# Patient Record
Sex: Female | Born: 1935 | Race: White | Hispanic: No | Smoking: Never smoker
Health system: Southern US, Community
[De-identification: ages and names within clinical notes are randomized; demographics above are authoritative.]

## PROBLEM LIST (undated history)

## (undated) DIAGNOSIS — F0280 Dementia in other diseases classified elsewhere without behavioral disturbance: Secondary | ICD-10-CM

## (undated) DIAGNOSIS — I639 Cerebral infarction, unspecified: Secondary | ICD-10-CM

## (undated) DIAGNOSIS — C9000 Multiple myeloma not having achieved remission: Secondary | ICD-10-CM

## (undated) DIAGNOSIS — F0390 Unspecified dementia without behavioral disturbance: Secondary | ICD-10-CM

## (undated) DIAGNOSIS — G309 Alzheimer's disease, unspecified: Secondary | ICD-10-CM

## (undated) DIAGNOSIS — I10 Essential (primary) hypertension: Secondary | ICD-10-CM

## (undated) HISTORY — DX: Cerebral infarction, unspecified: I63.9

---

## 2012-02-20 ENCOUNTER — Encounter (INDEPENDENT_AMBULATORY_CARE_PROVIDER_SITE_OTHER): Payer: Medicare Other | Admitting: Ophthalmology

## 2012-02-20 DIAGNOSIS — I10 Essential (primary) hypertension: Secondary | ICD-10-CM

## 2012-02-20 DIAGNOSIS — H35039 Hypertensive retinopathy, unspecified eye: Secondary | ICD-10-CM

## 2012-02-20 DIAGNOSIS — H2510 Age-related nuclear cataract, unspecified eye: Secondary | ICD-10-CM

## 2012-02-20 DIAGNOSIS — H43819 Vitreous degeneration, unspecified eye: Secondary | ICD-10-CM

## 2012-03-23 ENCOUNTER — Encounter (INDEPENDENT_AMBULATORY_CARE_PROVIDER_SITE_OTHER): Payer: Medicare Other | Admitting: Ophthalmology

## 2012-03-23 DIAGNOSIS — H35039 Hypertensive retinopathy, unspecified eye: Secondary | ICD-10-CM

## 2012-03-23 DIAGNOSIS — I10 Essential (primary) hypertension: Secondary | ICD-10-CM

## 2012-03-23 DIAGNOSIS — H4310 Vitreous hemorrhage, unspecified eye: Secondary | ICD-10-CM

## 2012-03-23 DIAGNOSIS — H2510 Age-related nuclear cataract, unspecified eye: Secondary | ICD-10-CM

## 2012-03-23 DIAGNOSIS — H43819 Vitreous degeneration, unspecified eye: Secondary | ICD-10-CM

## 2012-05-25 ENCOUNTER — Encounter (INDEPENDENT_AMBULATORY_CARE_PROVIDER_SITE_OTHER): Payer: Medicare Other | Admitting: Ophthalmology

## 2012-05-25 DIAGNOSIS — H4310 Vitreous hemorrhage, unspecified eye: Secondary | ICD-10-CM

## 2012-05-25 DIAGNOSIS — H43819 Vitreous degeneration, unspecified eye: Secondary | ICD-10-CM

## 2012-05-25 DIAGNOSIS — H35039 Hypertensive retinopathy, unspecified eye: Secondary | ICD-10-CM

## 2012-05-25 DIAGNOSIS — H2510 Age-related nuclear cataract, unspecified eye: Secondary | ICD-10-CM

## 2016-09-07 ENCOUNTER — Encounter (HOSPITAL_COMMUNITY): Payer: Self-pay | Admitting: *Deleted

## 2016-09-07 ENCOUNTER — Emergency Department (HOSPITAL_COMMUNITY): Payer: Medicare HMO

## 2016-09-07 ENCOUNTER — Inpatient Hospital Stay (HOSPITAL_COMMUNITY)
Admission: EM | Admit: 2016-09-07 | Discharge: 2016-09-13 | DRG: 064 | Disposition: A | Discharge status: Skilled Nursing Facility | Payer: Medicare HMO | Attending: Neurology | Admitting: Neurology

## 2016-09-07 DIAGNOSIS — I509 Heart failure, unspecified: Secondary | ICD-10-CM | POA: Diagnosis not present

## 2016-09-07 DIAGNOSIS — I10 Essential (primary) hypertension: Secondary | ICD-10-CM

## 2016-09-07 DIAGNOSIS — R4182 Altered mental status, unspecified: Secondary | ICD-10-CM | POA: Diagnosis not present

## 2016-09-07 DIAGNOSIS — D62 Acute posthemorrhagic anemia: Secondary | ICD-10-CM | POA: Diagnosis present

## 2016-09-07 DIAGNOSIS — S06350A Traumatic hemorrhage of left cerebrum without loss of consciousness, initial encounter: Secondary | ICD-10-CM

## 2016-09-07 DIAGNOSIS — I5189 Other ill-defined heart diseases: Secondary | ICD-10-CM

## 2016-09-07 DIAGNOSIS — E039 Hypothyroidism, unspecified: Secondary | ICD-10-CM

## 2016-09-07 DIAGNOSIS — Z681 Body mass index (BMI) 19 or less, adult: Secondary | ICD-10-CM | POA: Diagnosis not present

## 2016-09-07 DIAGNOSIS — E785 Hyperlipidemia, unspecified: Secondary | ICD-10-CM | POA: Diagnosis present

## 2016-09-07 DIAGNOSIS — I612 Nontraumatic intracerebral hemorrhage in hemisphere, unspecified: Secondary | ICD-10-CM | POA: Diagnosis not present

## 2016-09-07 DIAGNOSIS — I110 Hypertensive heart disease with heart failure: Secondary | ICD-10-CM | POA: Diagnosis present

## 2016-09-07 DIAGNOSIS — I5032 Chronic diastolic (congestive) heart failure: Secondary | ICD-10-CM | POA: Diagnosis present

## 2016-09-07 DIAGNOSIS — R4701 Aphasia: Secondary | ICD-10-CM | POA: Diagnosis present

## 2016-09-07 DIAGNOSIS — E876 Hypokalemia: Secondary | ICD-10-CM | POA: Diagnosis present

## 2016-09-07 DIAGNOSIS — I611 Nontraumatic intracerebral hemorrhage in hemisphere, cortical: Secondary | ICD-10-CM | POA: Diagnosis present

## 2016-09-07 DIAGNOSIS — E46 Unspecified protein-calorie malnutrition: Secondary | ICD-10-CM | POA: Diagnosis present

## 2016-09-07 DIAGNOSIS — R29810 Facial weakness: Secondary | ICD-10-CM | POA: Diagnosis present

## 2016-09-07 DIAGNOSIS — E8809 Other disorders of plasma-protein metabolism, not elsewhere classified: Secondary | ICD-10-CM

## 2016-09-07 DIAGNOSIS — C9000 Multiple myeloma not having achieved remission: Secondary | ICD-10-CM | POA: Diagnosis present

## 2016-09-07 DIAGNOSIS — R000 Tachycardia, unspecified: Secondary | ICD-10-CM

## 2016-09-07 DIAGNOSIS — E119 Type 2 diabetes mellitus without complications: Secondary | ICD-10-CM | POA: Diagnosis present

## 2016-09-07 DIAGNOSIS — E871 Hypo-osmolality and hyponatremia: Secondary | ICD-10-CM | POA: Diagnosis present

## 2016-09-07 DIAGNOSIS — I619 Nontraumatic intracerebral hemorrhage, unspecified: Secondary | ICD-10-CM | POA: Diagnosis not present

## 2016-09-07 DIAGNOSIS — I519 Heart disease, unspecified: Secondary | ICD-10-CM | POA: Diagnosis not present

## 2016-09-07 DIAGNOSIS — G936 Cerebral edema: Secondary | ICD-10-CM | POA: Diagnosis present

## 2016-09-07 DIAGNOSIS — S06320A Contusion and laceration of left cerebrum without loss of consciousness, initial encounter: Secondary | ICD-10-CM

## 2016-09-07 HISTORY — DX: Multiple myeloma not having achieved remission: C90.00

## 2016-09-07 HISTORY — DX: Essential (primary) hypertension: I10

## 2016-09-07 LAB — DIFFERENTIAL
BASOS PCT: 0 %
Basophils Absolute: 0 10*3/uL (ref 0.0–0.1)
EOS ABS: 0 10*3/uL (ref 0.0–0.7)
EOS PCT: 0 %
LYMPHS ABS: 0.8 10*3/uL (ref 0.7–4.0)
Lymphocytes Relative: 11 %
MONOS PCT: 6 %
Monocytes Absolute: 0.5 10*3/uL (ref 0.1–1.0)
Neutro Abs: 6.2 10*3/uL (ref 1.7–7.7)
Neutrophils Relative %: 83 %

## 2016-09-07 LAB — COMPREHENSIVE METABOLIC PANEL
ALT: 15 U/L (ref 14–54)
ANION GAP: 11 (ref 5–15)
AST: 23 U/L (ref 15–41)
Albumin: 3.9 g/dL (ref 3.5–5.0)
Alkaline Phosphatase: 43 U/L (ref 38–126)
BILIRUBIN TOTAL: 0.8 mg/dL (ref 0.3–1.2)
BUN: 13 mg/dL (ref 6–20)
CHLORIDE: 96 mmol/L — AB (ref 101–111)
CO2: 24 mmol/L (ref 22–32)
Calcium: 9.2 mg/dL (ref 8.9–10.3)
Creatinine, Ser: 0.88 mg/dL (ref 0.44–1.00)
GFR, EST NON AFRICAN AMERICAN: 59 mL/min — AB (ref >60)
Glucose, Bld: 102 mg/dL — ABNORMAL HIGH (ref 65–99)
POTASSIUM: 3.2 mmol/L — AB (ref 3.5–5.1)
Sodium: 131 mmol/L — ABNORMAL LOW (ref 135–145)
TOTAL PROTEIN: 6.7 g/dL (ref 6.5–8.1)

## 2016-09-07 LAB — I-STAT CHEM 8, ED
BUN: 15 mg/dL (ref 6–20)
CALCIUM ION: 1.13 mmol/L — AB (ref 1.15–1.40)
Chloride: 99 mmol/L — ABNORMAL LOW (ref 101–111)
Creatinine, Ser: 0.9 mg/dL (ref 0.44–1.00)
Glucose, Bld: 102 mg/dL — ABNORMAL HIGH (ref 65–99)
HCT: 36 % (ref 36.0–46.0)
HEMOGLOBIN: 12.2 g/dL (ref 12.0–15.0)
Potassium: 3.4 mmol/L — ABNORMAL LOW (ref 3.5–5.1)
SODIUM: 136 mmol/L (ref 135–145)
TCO2: 25 mmol/L (ref 0–100)

## 2016-09-07 LAB — APTT: APTT: 30 s (ref 24–36)

## 2016-09-07 LAB — CBC
HEMATOCRIT: 35.8 % — AB (ref 36.0–46.0)
HEMOGLOBIN: 12.2 g/dL (ref 12.0–15.0)
MCH: 30.6 pg (ref 26.0–34.0)
MCHC: 34.1 g/dL (ref 30.0–36.0)
MCV: 89.7 fL (ref 78.0–100.0)
Platelets: 193 10*3/uL (ref 150–400)
RBC: 3.99 MIL/uL (ref 3.87–5.11)
RDW: 13.1 % (ref 11.5–15.5)
WBC: 7.5 10*3/uL (ref 4.0–10.5)

## 2016-09-07 LAB — PROTIME-INR
INR: 1.02
Prothrombin Time: 13.5 seconds (ref 11.4–15.2)

## 2016-09-07 LAB — I-STAT TROPONIN, ED: TROPONIN I, POC: 0 ng/mL (ref 0.00–0.08)

## 2016-09-07 LAB — CBG MONITORING, ED: GLUCOSE-CAPILLARY: 103 mg/dL — AB (ref 65–99)

## 2016-09-07 MED ORDER — PANTOPRAZOLE SODIUM 40 MG IV SOLR
40.0000 mg | Freq: Every day | INTRAVENOUS | Status: DC
  Administered 2016-09-08 (×2): 40 mg via INTRAVENOUS
  Filled 2016-09-07 (×2): qty 40

## 2016-09-07 MED ORDER — SODIUM CHLORIDE 0.9 % IV SOLN
INTRAVENOUS | Status: DC
  Administered 2016-09-07: 23:00:00 via INTRAVENOUS
  Administered 2016-09-08: 75 mL/h via INTRAVENOUS

## 2016-09-07 MED ORDER — SENNOSIDES-DOCUSATE SODIUM 8.6-50 MG PO TABS
1.0000 | ORAL_TABLET | Freq: Two times a day (BID) | ORAL | Status: DC
  Administered 2016-09-08 – 2016-09-13 (×9): 1 via ORAL
  Filled 2016-09-07 (×10): qty 1

## 2016-09-07 MED ORDER — STROKE: EARLY STAGES OF RECOVERY BOOK
Freq: Once | Status: AC
  Administered 2016-09-07: 23:00:00
  Filled 2016-09-07: qty 1

## 2016-09-07 MED ORDER — IOPAMIDOL (ISOVUE-370) INJECTION 76%
INTRAVENOUS | Status: AC
  Administered 2016-09-07: 50 mL
  Filled 2016-09-07: qty 50

## 2016-09-07 MED ORDER — ACETAMINOPHEN 650 MG RE SUPP: 650.0000 mg | RECTAL | Status: DC | PRN

## 2016-09-07 MED ORDER — NICARDIPINE HCL IN NACL 20-0.86 MG/200ML-% IV SOLN
3.0000 mg/h | Freq: Once | INTRAVENOUS | Status: AC
  Administered 2016-09-07: 3 mg/h via INTRAVENOUS
  Filled 2016-09-07 (×2): qty 200

## 2016-09-07 MED ORDER — ACETAMINOPHEN 325 MG PO TABS
650.0000 mg | ORAL_TABLET | ORAL | Status: DC | PRN
  Administered 2016-09-11: 650 mg via ORAL
  Filled 2016-09-07: qty 2

## 2016-09-07 NOTE — ED Notes (Signed)
Patient transported to CT 

## 2016-09-07 NOTE — ED Notes (Signed)
Per MD Mesner, hold Cardene at this time as patient's SBPs have returned to goal at 140.

## 2016-09-07 NOTE — ED Triage Notes (Signed)
The pts speech clears for a few minutes then gets garbled again she follows directions intermittently  And has to be shown what you want then she does exactly as you ask  Headache today according to her brother who brought her here. Now she just says im ok

## 2016-09-07 NOTE — ED Provider Notes (Signed)
[facility] DEPT Provider Note   CSN: [id number] Arrival date & time: 09/07/16  1626     History   Chief Complaint Chief Complaint  Patient presents with  . Altered Mental Status    HPI Judith Henderson is a 82 y.o. female.  Her brother states that the patient is a 49 year old with unknown past medical history are unknown medications who's had at least a month worth of intermittent word slurring but over the last 24-48 hours significantly worsened and started having aphasia as well. He did not know see no neurologic deficits. She has no history of the same. As far as he knows she is not a stroke. No other recent illnesses. No exacerbating factors or associated symptoms otherwise.   The history is provided by a relative.    No past medical history on file.  There are no active problems to display for this patient.   No past surgical history on file.  OB History    No data available       Home Medications    Prior to Admission medications   Not on File    Family History No family history on file.  Social History Social History  Substance Use Topics  . Smoking status: Never Smoker  . Smokeless tobacco: Never Used  . Alcohol use No     Allergies   Patient has no known allergies.   Review of Systems Review of Systems  Unable to perform ROS: Mental status change     Physical Exam Updated Vital Signs BP 143/65   Pulse 73   Temp 98.7 F (37.1 C)   Resp 18   SpO2 98%   Physical Exam  Constitutional: She appears well-developed and well-nourished.  HENT:  Head: Normocephalic and atraumatic.  Eyes: Conjunctivae and EOM are normal.  Neck: Normal range of motion.  Cardiovascular: Normal rate and regular rhythm.   No murmur heard. Pulmonary/Chest: No stridor. No respiratory distress.  Abdominal: Soft. She exhibits no distension.  Musculoskeletal: Normal range of motion. She exhibits no edema or deformity.  Neurological: She is alert. No cranial  nerve deficit (no obvious CN deficits). She exhibits normal muscle tone. Coordination abnormal.  Does not follow directions  Skin: Skin is warm and dry.  Nursing note and vitals reviewed.    ED Treatments / Results  Labs (all labs ordered are listed, but only abnormal results are displayed) Labs Reviewed  CBC - Abnormal; Notable for the following:       Result Value   HCT 35.8 (*)    All other components within normal limits  COMPREHENSIVE METABOLIC PANEL - Abnormal; Notable for the following:    Sodium 131 (*)    Potassium 3.2 (*)    Chloride 96 (*)    Glucose, Bld 102 (*)    GFR calc non Af Amer 59 (*)    All other components within normal limits  CBG MONITORING, ED - Abnormal; Notable for the following:    Glucose-Capillary 103 (*)    All other components within normal limits  I-STAT CHEM 8, ED - Abnormal; Notable for the following:    Potassium 3.4 (*)    Chloride 99 (*)    Glucose, Bld 102 (*)    Calcium, Ion 1.13 (*)    All other components within normal limits  PROTIME-INR  APTT  DIFFERENTIAL  Randolm Idol, ED    EKG  EKG Interpretation  Date/Time:  Saturday September 07 2016 17:31:02 EST Ventricular Rate:  75 PR  Interval:  162 QRS Duration: 84 QT Interval:  380 QTC Calculation: 424 R Axis:   64 Text Interpretation:  Normal sinus rhythm Normal ECG No old tracing to compare Confirmed by [facility] MD, Imanii Gosdin [phone]) on 09/07/2016 5:36:35 PM       Radiology Ct Head Wo Contrast  Result Date: 09/07/2016 CLINICAL DATA:  Altered mental status.  Dizziness. EXAM: CT HEAD WITHOUT CONTRAST TECHNIQUE:?: TECHNIQUE:? Contiguous axial images were obtained from the base of the skull through the vertex without intravenous contrast. COMPARISON:  None. FINDINGS: Brain: A large hemorrhage within the left temporal lobe measures 6.2 x 2.0 x 2.3 cm (volume = 15 cm^3). This results in mass effect with effacement of the overlying sulci. No significant midline shift is present. Mild  atrophy and white matter disease is present otherwise. The brainstem and cerebellum are within normal limits. Vascular: Atherosclerotic calcifications are present within the cavernous internal carotid arteries and at the dural margin of the vertebral arteries. There is no hyperdense vessel. Skull: The calvarium is intact. Sinuses/Orbits: A single posterior right ethmoid air cell is opacified. The remaining paranasal sinuses and mastoid air cells are clear. IMPRESSION: 1. Large volume hemorrhage within the left temporal lobe. This is an atypical location for hemorrhage. Underlying mass is not excluded. Significant mass effect is present as described. There is no midline shift or herniation. Right follow-up MRI of the brain without and with contrast in 2-3 weeks after initial blood products have partially cleared. 2. Mild atrophy and white matter disease otherwise. 3. Atherosclerosis. Electronically Signed   By: San Morelle M.D.   On: 09/07/2016 18:34    Procedures Procedures (including critical care time)  CRITICAL CARE Performed by: Merrily Pew Total critical care time: 35 minutes Critical care time was exclusive of separately billable procedures and treating other patients. Critical care was necessary to treat or prevent imminent or life-threatening deterioration. Critical care was time spent personally by me on the following activities: development of treatment plan with patient and/or surrogate as well as nursing, discussions with consultants, evaluation of patient's response to treatment, examination of patient, obtaining history from patient or surrogate, ordering and performing treatments and interventions, ordering and review of laboratory studies, ordering and review of radiographic studies, pulse oximetry and re-evaluation of patient's condition.   Medications Ordered in ED Medications - No data to display   Initial Impression / Assessment and Plan / ED Course  I have reviewed  the triage vital signs and the nursing notes.  Pertinent labs & imaging results that were available during my care of the patient were reviewed by me and considered in my medical decision making (see chart for details).  Clinical Course    Large temporal head bleed. No obvious seizures. BP ok, no indication for nicardipine. Discussed with neurology who will see. Paged NSG to comment as well.  NSG says no acute intervention at this time. Neurology will admit. Prn nicardipine ordered as her BP sees to be increasing slowly.   Final Clinical Impressions(s) / ED Diagnoses   Final diagnoses:  Intraparenchymal hematoma of brain, left, without loss of consciousness, initial encounter [facility])    New Prescriptions New Prescriptions   No medications on file     Merrily Pew, MD 09/08/16 [phone]

## 2016-09-07 NOTE — ED Triage Notes (Signed)
The pt  Cannot speak very well garbled speech   She cannot answer questions  She cannot follow commands or instructiions.  Her brother is with her  She has had this since yesterday  When her brother spoke to her on the phone.  Today he went to check on her and found all her symptoms worse  She cares for her ibed ridden husband

## 2016-09-07 NOTE — ED Notes (Signed)
Purse with all contents and wrist watch taken home by brother [place]. Rings remain on Patient's left hand as we could not remove them. Clothing and jacket remain with patient.

## 2016-09-07 NOTE — H&P (Signed)
Admission H&P    Chief Complaint: Acute onset of confusion and speech abnormality.  HPI: Judith Henderson is an 82 y.o. female with a history hypertension and hyperlipidemia presenting with new onset of confusion and speech output abnormality. She has difficulty understanding what is being said to her and speech output is nonsensical, for the most part. Symptoms started on 09/06/2016 and were noted by family to be worse today. No facial droop is been seen. Speech is not been slurred. She's had no focal weakness of extremities. There is no previous history of stroke nor TIA. CT scan of her head showed a large volume hemorrhage involving the left temporal lobe with surrounding edema and mass effect. CT angiogram showed no signs of aneurysm, nor AVM or mass lesion.NIH stroke score was 5.  LSN: 09/06/2016 tPA Given: No: Acute ICH mRankin:  History reviewed. No pertinent past medical history.  No past surgical history on file.  No family history on file. Social History:  reports that she has never smoked. She has never used smokeless tobacco. She reports that she does not drink alcohol. Her drug history is not on file.  Allergies: No Known Allergies  Medications: Preadmission medicat ROS: Unavailable due to patient's acute aphasia.  Physical Examination: Blood pressure 134/65, pulse 68, temperature 98.7 F (37.1 C), resp. rate 15, SpO2 99 %.  HEENT-  Normocephalic, no lesions, without obvious abnormality.  Normal external eye and conjunctiva.  Normal TM's bilaterally.  Normal auditory canals and external ears. Normal external nose, mucus membranes and septum.  Normal pharynx. Neck supple with no masses, nodes, nodules or enlargement. Cardiovascular - regular rate and rhythm, S1, S2 normal, no murmur, click, rub or gallop Lungs - chest clear, no wheezing, rales, normal symmetric air entry Abdomen - soft, non-tender; bowel sounds normal; no masses,  no organomegaly Extremities - no joint  deformities, effusion, or inflammation and no edema  Neurologic Examination: Mental Status: Alert, moderately severe receptive and expressive aphasia. Marked difficulty understanding simple commands. Cranial Nerves: II-Visual fields were normal to visual confrontation. III/IV/VI-Pupils were equal and reacted normally to light. Extraocular movements were full and conjugate.    V/VII-no facial numbness and no facial weakness. VIII-normal. X-no dysarthria.. Motor: 5/5 bilaterally with normal tone and bulk Sensory: Normal throughout. Deep Tendon Reflexes: 1+ and symmetric. Plantars: Mute bilaterally Carotid auscultation: Normal  Results for orders placed or performed during the hospital encounter of 09/07/16 (from the past 48 hour(s))  CBG monitoring, ED     Status: Abnormal   Collection Time: 09/07/16  5:50 PM  Result Value Ref Range   Glucose-Capillary 103 (H) 65 - 99 mg/dL  I-stat troponin, ED     Status: None   Collection Time: 09/07/16  5:59 PM  Result Value Ref Range   Troponin i, poc 0.00 0.00 - 0.08 ng/mL   Comment 3            Comment: Due to the release kinetics of cTnI, a negative result within the first hours of the onset of symptoms does not rule out myocardial infarction with certainty. If myocardial infarction is still suspected, repeat the test at appropriate intervals.   I-Stat Chem 8, ED     Status: Abnormal   Collection Time: 09/07/16  6:01 PM  Result Value Ref Range   Sodium 136 135 - 145 mmol/L   Potassium 3.4 (L) 3.5 - 5.1 mmol/L   Chloride 99 (L) 101 - 111 mmol/L   BUN 15 6 - 20 mg/dL     Creatinine, Ser 0.90 0.44 - 1.00 mg/dL   Glucose, Bld 102 (H) 65 - 99 mg/dL   Calcium, Ion 1.13 (L) 1.15 - 1.40 mmol/L   TCO2 25 0 - 100 mmol/L   Hemoglobin 12.2 12.0 - 15.0 g/dL   HCT 36.0 36.0 - 46.0 %  Protime-INR     Status: None   Collection Time: 09/07/16  6:02 PM  Result Value Ref Range   Prothrombin Time 13.5 11.4 - 15.2 seconds   INR 1.02   APTT      Status: None   Collection Time: 09/07/16  6:02 PM  Result Value Ref Range   aPTT 30 24 - 36 seconds  CBC     Status: Abnormal   Collection Time: 09/07/16  6:02 PM  Result Value Ref Range   WBC 7.5 4.0 - 10.5 K/uL   RBC 3.99 3.87 - 5.11 MIL/uL   Hemoglobin 12.2 12.0 - 15.0 g/dL   HCT 35.8 (L) 36.0 - 46.0 %   MCV 89.7 78.0 - 100.0 fL   MCH 30.6 26.0 - 34.0 pg   MCHC 34.1 30.0 - 36.0 g/dL   RDW 13.1 11.5 - 15.5 %   Platelets 193 150 - 400 K/uL  Differential     Status: None   Collection Time: 09/07/16  6:02 PM  Result Value Ref Range   Neutrophils Relative % 83 %   Neutro Abs 6.2 1.7 - 7.7 K/uL   Lymphocytes Relative 11 %   Lymphs Abs 0.8 0.7 - 4.0 K/uL   Monocytes Relative 6 %   Monocytes Absolute 0.5 0.1 - 1.0 K/uL   Eosinophils Relative 0 %   Eosinophils Absolute 0.0 0.0 - 0.7 K/uL   Basophils Relative 0 %   Basophils Absolute 0.0 0.0 - 0.1 K/uL  Comprehensive metabolic panel     Status: Abnormal   Collection Time: 09/07/16  6:02 PM  Result Value Ref Range   Sodium 131 (L) 135 - 145 mmol/L   Potassium 3.2 (L) 3.5 - 5.1 mmol/L   Chloride 96 (L) 101 - 111 mmol/L   CO2 24 22 - 32 mmol/L   Glucose, Bld 102 (H) 65 - 99 mg/dL   BUN 13 6 - 20 mg/dL   Creatinine, Ser 0.88 0.44 - 1.00 mg/dL   Calcium 9.2 8.9 - 10.3 mg/dL   Total Protein 6.7 6.5 - 8.1 g/dL   Albumin 3.9 3.5 - 5.0 g/dL   AST 23 15 - 41 U/L   ALT 15 14 - 54 U/L   Alkaline Phosphatase 43 38 - 126 U/L   Total Bilirubin 0.8 0.3 - 1.2 mg/dL   GFR calc non Af Amer 59 (L) >60 mL/min   GFR calc Af Amer >60 >60 mL/min    Comment: (NOTE) The eGFR has been calculated using the CKD EPI equation. This calculation has not been validated in all clinical situations. eGFR's persistently <60 mL/min signify possible Chronic Kidney Disease.    Anion gap 11 5 - 15   Ct Head Wo Contrast  Result Date: 09/07/2016 CLINICAL DATA:  Altered mental status.  Dizziness. EXAM: CT HEAD WITHOUT CONTRAST TECHNIQUE:?: TECHNIQUE:?  Contiguous axial images were obtained from the base of the skull through the vertex without intravenous contrast. COMPARISON:  None. FINDINGS: Brain: A large hemorrhage within the left temporal lobe measures 6.2 x 2.0 x 2.3 cm (volume = 15 cm^3). This results in mass effect with effacement of the overlying sulci. No significant midline shift is present. Mild atrophy  and white matter disease is present otherwise. The brainstem and cerebellum are within normal limits. Vascular: Atherosclerotic calcifications are present within the cavernous internal carotid arteries and at the dural margin of the vertebral arteries. There is no hyperdense vessel. Skull: The calvarium is intact. Sinuses/Orbits: A single posterior right ethmoid air cell is opacified. The remaining paranasal sinuses and mastoid air cells are clear. IMPRESSION: 1. Large volume hemorrhage within the left temporal lobe. This is an atypical location for hemorrhage. Underlying mass is not excluded. Significant mass effect is present as described. There is no midline shift or herniation. Right follow-up MRI of the brain without and with contrast in 2-3 weeks after initial blood products have partially cleared. 2. Mild atrophy and white matter disease otherwise. 3. Atherosclerosis. Electronically Signed   By: Christopher  Mattern M.D.   On: 09/07/2016 18:34    Assessment: 82 y.o. female history of hypertension and hyperli presenting with acute left intraparenchymal hemorrhage involving the temporal lobe with associated vasogenic edema and mass effect.  Stroke Risk Factors - hypertension, hyperlipidemia  Plan: 1. HgbA1c, fasting lipid panel 2. MRI of the brain without contrast 3. Speech consult 4. Echocardiogram 5. Prophylactic therapy-None 6. Risk factor modification 8. Telemetry monitoring  C.R. , MD [facility] [phone]  09/07/2016, 8:23 PM    

## 2016-09-07 NOTE — ED Notes (Signed)
Pt. Returned from Ct scan, placed on monitor and BP cuff.

## 2016-09-07 NOTE — ED Triage Notes (Signed)
The pt does not live here  She lives in [place]  Neither her or the pts brother know her pmh no meds with them

## 2016-09-08 ENCOUNTER — Inpatient Hospital Stay (HOSPITAL_COMMUNITY): Payer: Medicare HMO

## 2016-09-08 ENCOUNTER — Encounter (HOSPITAL_COMMUNITY): Payer: Self-pay | Admitting: Neurology

## 2016-09-08 DIAGNOSIS — I10 Essential (primary) hypertension: Secondary | ICD-10-CM

## 2016-09-08 LAB — SODIUM
Sodium: 139 mmol/L (ref 135–145)
Sodium: 136 mmol/L (ref 135–145)

## 2016-09-08 LAB — GLUCOSE, CAPILLARY
Glucose-Capillary: 83 mg/dL (ref 65–99)
Glucose-Capillary: 81 mg/dL (ref 65–99)
Glucose-Capillary: 91 mg/dL (ref 65–99)
Glucose-Capillary: 105 mg/dL — ABNORMAL HIGH (ref 65–99)

## 2016-09-08 LAB — MRSA PCR SCREENING: MRSA by PCR: NEGATIVE

## 2016-09-08 MED ORDER — GADOBENATE DIMEGLUMINE 529 MG/ML IV SOLN
10.0000 mL | Freq: Once | INTRAVENOUS | Status: AC | PRN
  Administered 2016-09-08: 10 mL via INTRAVENOUS

## 2016-09-08 MED ORDER — WHITE PETROLATUM GEL
Status: AC
  Administered 2016-09-08: 1
  Filled 2016-09-08: qty 1

## 2016-09-08 MED ORDER — SODIUM CHLORIDE 0.9 % IV SOLN
30.0000 meq | Freq: Once | INTRAVENOUS | Status: AC
  Administered 2016-09-08: 30 meq via INTRAVENOUS
  Filled 2016-09-08: qty 15

## 2016-09-08 MED ORDER — NICARDIPINE HCL IN NACL 20-0.86 MG/200ML-% IV SOLN
3.0000 mg/h | INTRAVENOUS | Status: DC
  Administered 2016-09-08 (×2): 5 mg/h via INTRAVENOUS
  Filled 2016-09-08: qty 200

## 2016-09-08 MED ORDER — BISACODYL 10 MG RE SUPP: 10.0000 mg | Freq: Every day | RECTAL | Status: DC | PRN

## 2016-09-08 MED ORDER — INSULIN ASPART 100 UNIT/ML ~~LOC~~ SOLN
0.0000 [IU] | Freq: Four times a day (QID) | SUBCUTANEOUS | Status: DC
  Administered 2016-09-11 – 2016-09-12 (×2): 1 [IU] via SUBCUTANEOUS

## 2016-09-08 NOTE — Progress Notes (Signed)
STROKE TEAM PROGRESS NOTE   HISTORY OF PRESENT ILLNESS (per record) Judith Henderson is an 82 y.o. female with a history hypertension and hyperlipidemia presenting with new onset of confusion and speech output abnormality. She has difficulty understanding what is being said to her and speech output is nonsensical, for the most part. Symptoms started on 09/06/2016 and were noted by family to be worse today. No facial droop is been seen. Speech is not been slurred. She's had no focal weakness of extremities. There is no previous history of stroke nor TIA. CT scan of her head showed a large volume hemorrhage involving the left temporal lobe with surrounding edema and mass effect. CT angiogram showed no signs of aneurysm, nor AVM or mass lesion.NIH stroke score was 5.  LSN: 09/06/2016 tPA Given: No: Acute ICH mRankin:   SUBJECTIVE (INTERVAL HISTORY) Her family was not at the bedside.  Overall she is unable to express how she feels due to her aphasia.  Her RN did not report changes in her condition overnight; thus, her condition appears to unchanged. The Cardene is presently on hold   OBJECTIVE Temp:  [98.3 F (36.8 C)-98.8 F (37.1 C)] 98.7 F (37.1 C) (12/03 0438) Pulse Rate:  [61-81] 70 (12/03 0600) Resp:  [12-28] 18 (12/03 0600) BP: (122-160)/(55-144) 140/63 (12/03 0600) SpO2:  [85 %-100 %] 97 % (12/03 0600) Weight:  [48.8 kg (107 lb 9.4 oz)] 48.8 kg (107 lb 9.4 oz) (12/02 2330)  CBC:   Recent Labs Lab 09/07/16 1801 09/07/16 1802  WBC  --  7.5  NEUTROABS  --  6.2  HGB 12.2 12.2  HCT 36.0 35.8*  MCV  --  89.7  PLT  --  0000000    Basic Metabolic Panel:   Recent Labs Lab 09/07/16 1801 09/07/16 1802  NA 136 131*  K 3.4* 3.2*  CL 99* 96*  CO2  --  24  GLUCOSE 102* 102*  BUN 15 13  CREATININE 0.90 0.88  CALCIUM  --  9.2    Lipid Panel: No results found for: CHOL, TRIG, HDL, CHOLHDL, VLDL, LDLCALC HgbA1c: No results found for: HGBA1C Urine Drug Screen: No results found  for: LABOPIA, COCAINSCRNUR, LABBENZ, AMPHETMU, THCU, LABBARB    IMAGING  Ct Angio Head and Neck W Or Wo Contrast 09/07/2016 1. Focal enhancing mass lesion or vascular abnormality to explain the left temporal hemorrhage.  2. Mild atherosclerotic changes at the carotid bifurcations bilaterally without significant stenosis.  3. Mild atherosclerotic changes at the aortic arch without significant stenosis.  4. Irregularity of the cervical left internal carotid artery raises the possibility of fibromuscular dysplasia.  5. Tortuosity of the cervical internal carotid arteries bilaterally likely resulting from chronic hypertension.   Ct Head Wo Contrast 09/07/2016 1. Large volume hemorrhage within the left temporal lobe. This is an atypical location for hemorrhage. Underlying mass is not excluded. Significant mass effect is present as described. There is no midline shift or herniation. Recommend follow-up MRI of the brain without and with contrast in 2-3 weeks after initial blood products have partially cleared.  2. Mild atrophy and white matter disease otherwise.  3. Atherosclerosis.    PHYSICAL EXAM HEENT-  Normocephalic, no lesions, without obvious abnormality.  Normal external eye and conjunctiva.  Normal TM's bilaterally.  Normal auditory canals and external ears. Normal external nose, mucus membranes and septum.  Normal pharynx. Neck supple with no masses, nodes, nodules or enlargement. Cardiovascular - regular rate and rhythm, S1, S2 normal, no murmur, click, rub  or gallop Lungs - chest clear, no wheezing, rales, normal symmetric air entry Abdomen - soft, non-tender; bowel sounds normal; no masses,  no organomegaly Extremities - no joint deformities, effusion, or inflammation and no edema  Neurologic Examination: Mental Status: Alert, moderately severe receptive and expressive aphasia. Marked difficulty understanding simple commands.  Cranial Nerves: II-Visual fields were normal to  visual confrontation. III/IV/VI-Pupils were equal and reacted normally to light. Extraocular movements were full and conjugate.    V/VII-no facial numbness and no facial weakness. VIII-normal. X-difficult to assess dysarthria given severity of aphasia and paucity of speech  Motor: 5/5 bilaterally with normal tone and bulk Sensory: Normal throughout. Deep Tendon Reflexes: 1+ and symmetric. Coordination:  Could no be assessed as patient unable to follow commands or mimic Gait:  defered  ASSESSMENT/PLAN [name] is a 82 y.o. female with history of hypertension and hyperlipidemia presenting with speech difficulties and confusion. She did not receive IV t-PA due to hemorrhagic stroke.  Hemorrhagic stroke:  Dominant - Large volume hemorrhage within the left temporal lobe.   Resultant  Global aphasia  MRI - pending  MRA - refer to CTA  CTA H&N - Focal enhancing mass lesion or vascular abnormality to explain the left temporal hemorrhage.    Irregularity of the cervical left internal carotid artery raises the possibility of fibromuscular dysplasia.   CT Head - Large volume hemorrhage within the left temporal lobe.   Carotid Doppler - CTA neck done  2D Echo - not indicated  LDL - not indicated  HgbA1c  - not indicated  VTE prophylaxis -  SCDs Diet NPO time specified  No antithrombotic prior to admission, now on No antithrombotic secondary to hemorrhage.  Ongoing aggressive stroke risk factor management  Therapy recommendations: Pending  Disposition:  Pending  Hypertension  BP - somewhat high at times - Cardene drip on/off  Current goal set at <160  Added lisinopril 10mg  daily  Long-term BP goal normotensive  Hyperlipidemia  Home meds: Zocor 20 mg daily not resumed in hospital.  Continue statin at discharge  Other Stroke Risk Factors  Advanced age  Other Active Problems  Hypokalemia - 3.2 -> supplement - Bmet in AM  Hyponatremia - 131  Atypical  location for hemorrhage. Underlying mass is not excluded -> MRI with contrast.  Hypertension - Cardene drip  Hospital day # 1  CRITICAL CARE NEUROLOGY ATTENDING NOTE Patient was seen and examined by me personally. I independently viewed imaging studies, participated in medical decision making and plan of care. The laboratory and radiographic studies were personally reviewed by me.  ROS:  Pertinent positives could not be fully documented due to aphasia  Assessment and plan completed by me personally and fully documented above.  MRI of head with and without contrast  Q6 hour sodium checks to monitor hyponatremia; may need to institute hypertonic saline for cerebral edema.  Ordered call MD if NA<130  AMLS:  CMP and CBC  Continue goal SBP<160  Started Lisinopril 10mg  daily, goal to remain off of Cardene gtt  Initiate CCM protocols for electrolytes, blood sugars and bowel management  Discontinue bedrest; order PT/OT/ST  If fails ST evaluation will need NGT and nutrition consult  Condition is unchanged   This patient is critically ill and at significant risk of neurological worsening, death and care requires constant monitoring of vital signs, hemodynamics,respiratory and cardiac monitoring, extensive review of multiple databases, frequent neurological assessment, discussion with family, other specialists and medical decision making of high complexity.  This critical care time does not reflect procedure time, or teaching time or supervisory time of PA/NP/Med Resident etc. but could involve care discussion time.  I spent 35 minutes of Neurocritical Care time in the care of  this patient.  SIGNED BY: Dr. Elissa Hefty     To contact Stroke Continuity provider, please refer to [email or web address] After hours, contact General Neurology

## 2016-09-08 NOTE — Evaluation (Addendum)
Clinical/Bedside Swallow Evaluation Patient Details  Name: Judith Henderson MRN: [id number] Date of Birth: 08-09-1930  Today's Date: 09/08/2016 Time: SLP Start Time (ACUTE ONLY): 1716 SLP Stop Time (ACUTE ONLY): 1735 SLP Time Calculation (min) (ACUTE ONLY): 19 min  Past Medical History:  Past Medical History:  Diagnosis Date  . Essential hypertension    Past Surgical History: No past surgical history on file. HPI:  Kobee Sikkema an 82 y.o.femalewith ahistory hypertension, hyperlipidemia presenting with new onset of confusion and speech output abnormality. CT scan of her head showed a large volume hemorrhage involving the left temporal lobe with surrounding edema and mass effect.    Assessment / Plan / Recommendation Clinical Impression  Mildly prolonged oral manipulation with risk for pocketing due to right sided weakness. No s/s aspiration present however will continue to treat to further determine safety and efficiency of texture and monitor vitals for suspicion of silent aspiration. Recommend Dys 3 texture, thin liquids, pills whole in applesauce, no straws initially and check for right sided pocketing.  Speech-language-cognitive assessment will be initiated.    Aspiration Risk  Moderate aspiration risk    Diet Recommendation Dysphagia 3 (Mech soft);Thin liquid   Liquid Administration via: Cup;No straw Medication Administration: Whole meds with puree Supervision: Patient able to self feed;Full supervision/cueing for compensatory strategies Compensations: Slow rate;Small sips/bites;Lingual sweep for clearance of pocketing Postural Changes: Seated upright at 90 degrees    Other  Recommendations Oral Care Recommendations: Oral care BID   Follow up Recommendations  (TBD)      Frequency and Duration min 2x/week  2 weeks       Prognosis Prognosis for Safe Diet Advancement: Good      Swallow Study   General HPI: Judith Henderson an 82 y.o.femalewith ahistory  hypertension, hyperlipidemia presenting with new onset of confusion and speech output abnormality. CT scan of her head showed a large volume hemorrhage involving the left temporal lobe with surrounding edema and mass effect.  Type of Study: Bedside Swallow Evaluation Previous Swallow Assessment:  (none) Diet Prior to this Study: NPO Temperature Spikes Noted: No Respiratory Status: Room air History of Recent Intubation: No Behavior/Cognition: Alert;Cooperative;Pleasant mood;Requires cueing Oral Cavity Assessment: Within Functional Limits Oral Care Completed by SLP: No Oral Cavity - Dentition: Adequate natural dentition Vision: Functional for self-feeding Self-Feeding Abilities: Able to feed self Patient Positioning: Upright in bed Baseline Vocal Quality: Normal Volitional Cough: Strong Volitional Swallow: Able to elicit    Oral/Motor/Sensory Function Overall Oral Motor/Sensory Function: Moderate impairment Facial ROM: Reduced right;Suspected CN VII (facial) dysfunction Facial Symmetry: Abnormal symmetry right;Suspected CN VII (facial) dysfunction Facial Strength: Suspected CN VII (facial) dysfunction;Reduced right Facial Sensation: Reduced right Lingual ROM:  (decreased ROM)   Ice Chips Ice chips: Not tested   Thin Liquid Thin Liquid: Impaired Presentation: Cup;Straw Pharyngeal  Phase Impairments: Suspected delayed Swallow    Nectar Thick Nectar Thick Liquid: Not tested   Honey Thick Honey Thick Liquid: Not tested   Puree Puree: Within functional limits   Solid   GO   Solid: Impaired Oral Phase Impairments: Reduced lingual movement/coordination        Houston Siren 09/08/2016,5:49 PM [phone]

## 2016-09-08 NOTE — Progress Notes (Signed)
Spoke to Dr. Belenda Cruise and MD approved for pt to go to MRI without RN.

## 2016-09-09 ENCOUNTER — Inpatient Hospital Stay (HOSPITAL_COMMUNITY): Payer: Medicare HMO

## 2016-09-09 ENCOUNTER — Encounter (HOSPITAL_COMMUNITY): Payer: Self-pay | Admitting: Physical Medicine & Rehabilitation

## 2016-09-09 DIAGNOSIS — E119 Type 2 diabetes mellitus without complications: Secondary | ICD-10-CM

## 2016-09-09 DIAGNOSIS — I519 Heart disease, unspecified: Secondary | ICD-10-CM

## 2016-09-09 DIAGNOSIS — C9000 Multiple myeloma not having achieved remission: Secondary | ICD-10-CM

## 2016-09-09 DIAGNOSIS — E876 Hypokalemia: Secondary | ICD-10-CM

## 2016-09-09 DIAGNOSIS — I612 Nontraumatic intracerebral hemorrhage in hemisphere, unspecified: Secondary | ICD-10-CM

## 2016-09-09 DIAGNOSIS — E46 Unspecified protein-calorie malnutrition: Secondary | ICD-10-CM

## 2016-09-09 DIAGNOSIS — E039 Hypothyroidism, unspecified: Secondary | ICD-10-CM

## 2016-09-09 DIAGNOSIS — I10 Essential (primary) hypertension: Secondary | ICD-10-CM

## 2016-09-09 DIAGNOSIS — I509 Heart failure, unspecified: Secondary | ICD-10-CM

## 2016-09-09 DIAGNOSIS — D62 Acute posthemorrhagic anemia: Secondary | ICD-10-CM

## 2016-09-09 DIAGNOSIS — I5189 Other ill-defined heart diseases: Secondary | ICD-10-CM

## 2016-09-09 DIAGNOSIS — I619 Nontraumatic intracerebral hemorrhage, unspecified: Secondary | ICD-10-CM

## 2016-09-09 DIAGNOSIS — R000 Tachycardia, unspecified: Secondary | ICD-10-CM

## 2016-09-09 LAB — CBC
HCT: 31.3 % — ABNORMAL LOW (ref 36.0–46.0)
Hemoglobin: 10.8 g/dL — ABNORMAL LOW (ref 12.0–15.0)
MCH: 30.8 pg (ref 26.0–34.0)
MCHC: 34.5 g/dL (ref 30.0–36.0)
MCV: 89.2 fL (ref 78.0–100.0)
PLATELETS: 188 10*3/uL (ref 150–400)
RBC: 3.51 MIL/uL — ABNORMAL LOW (ref 3.87–5.11)
RDW: 13.1 % (ref 11.5–15.5)
WBC: 5.8 10*3/uL (ref 4.0–10.5)

## 2016-09-09 LAB — LIPID PANEL
Cholesterol: 176 mg/dL (ref 0–200)
HDL: 51 mg/dL (ref >40)
LDL CALC: 113 mg/dL — AB (ref 0–99)
Total CHOL/HDL Ratio: 3.5 RATIO
Triglycerides: 62 mg/dL (ref <150)
VLDL: 12 mg/dL (ref 0–40)

## 2016-09-09 LAB — GLUCOSE, CAPILLARY
GLUCOSE-CAPILLARY: 100 mg/dL — AB (ref 65–99)
GLUCOSE-CAPILLARY: 136 mg/dL — AB (ref 65–99)
GLUCOSE-CAPILLARY: 87 mg/dL (ref 65–99)
GLUCOSE-CAPILLARY: 90 mg/dL (ref 65–99)
Glucose-Capillary: 104 mg/dL — ABNORMAL HIGH (ref 65–99)

## 2016-09-09 LAB — ECHOCARDIOGRAM COMPLETE
Height: 64 in
Weight: 1760.15 [oz_av]

## 2016-09-09 LAB — COMPREHENSIVE METABOLIC PANEL
ALT: 13 U/L — AB (ref 14–54)
AST: 20 U/L (ref 15–41)
Albumin: 3.3 g/dL — ABNORMAL LOW (ref 3.5–5.0)
Alkaline Phosphatase: 34 U/L — ABNORMAL LOW (ref 38–126)
Anion gap: 9 (ref 5–15)
BILIRUBIN TOTAL: 0.9 mg/dL (ref 0.3–1.2)
BUN: 11 mg/dL (ref 6–20)
CO2: 22 mmol/L (ref 22–32)
CREATININE: 0.74 mg/dL (ref 0.44–1.00)
Calcium: 8.4 mg/dL — ABNORMAL LOW (ref 8.9–10.3)
Chloride: 105 mmol/L (ref 101–111)
Glucose, Bld: 91 mg/dL (ref 65–99)
Potassium: 3.1 mmol/L — ABNORMAL LOW (ref 3.5–5.1)
Sodium: 136 mmol/L (ref 135–145)
TOTAL PROTEIN: 5.9 g/dL — AB (ref 6.5–8.1)

## 2016-09-09 LAB — VITAMIN B12: Vitamin B-12: 2034 pg/mL — ABNORMAL HIGH (ref 180–914)

## 2016-09-09 LAB — TSH: TSH: 1.741 u[IU]/mL (ref 0.350–4.500)

## 2016-09-09 LAB — SODIUM: SODIUM: 138 mmol/L (ref 135–145)

## 2016-09-09 MED ORDER — LEVOTHYROXINE SODIUM 75 MCG PO TABS
75.0000 ug | ORAL_TABLET | Freq: Every day | ORAL | Status: DC
  Administered 2016-09-09 – 2016-09-13 (×5): 75 ug via ORAL
  Filled 2016-09-09 (×5): qty 1

## 2016-09-09 MED ORDER — QUETIAPINE FUMARATE 25 MG PO TABS
25.0000 mg | ORAL_TABLET | Freq: Once | ORAL | Status: AC
  Administered 2016-09-09: 25 mg via ORAL
  Filled 2016-09-09: qty 1

## 2016-09-09 MED ORDER — LABETALOL HCL 5 MG/ML IV SOLN: 10.0000 mg | INTRAVENOUS | Status: DC | PRN

## 2016-09-09 MED ORDER — QUETIAPINE FUMARATE 50 MG PO TABS
50.0000 mg | ORAL_TABLET | Freq: Once | ORAL | Status: AC
  Administered 2016-09-09: 50 mg via ORAL
  Filled 2016-09-09: qty 1

## 2016-09-09 MED ORDER — SIMVASTATIN 20 MG PO TABS
20.0000 mg | ORAL_TABLET | Freq: Every day | ORAL | Status: DC
  Administered 2016-09-09 – 2016-09-12 (×4): 20 mg via ORAL
  Filled 2016-09-09: qty 4
  Filled 2016-09-09 (×3): qty 1

## 2016-09-09 MED ORDER — PANTOPRAZOLE SODIUM 40 MG PO TBEC
40.0000 mg | DELAYED_RELEASE_TABLET | Freq: Every day | ORAL | Status: DC
  Administered 2016-09-09 – 2016-09-13 (×5): 40 mg via ORAL
  Filled 2016-09-09 (×5): qty 1

## 2016-09-09 MED ORDER — LOSARTAN POTASSIUM 50 MG PO TABS
100.0000 mg | ORAL_TABLET | Freq: Every day | ORAL | Status: DC
  Administered 2016-09-09 – 2016-09-13 (×5): 100 mg via ORAL
  Filled 2016-09-09 (×5): qty 2

## 2016-09-09 NOTE — Consult Note (Signed)
Physical Medicine and Rehabilitation Consult Reason for Consult: Acute left intraparenchymal hemorrhage Referring Physician: Dr.Xu   HPI: Judith Henderson is a 82 y.o.right handed  female with history of diastolic congestive heart failure, diabetes mellitus, multiple myeloma and essential hypertension. History taken from chart review and family.  Patient independent prior to admission she is the caregiver for her spouse. One level home with a basement. 3-4 steps to entry. She does have family coming in from out-of-town to assist but only for a short time. Presented 09/07/2016 with aphasia and altered mental status. CT of the head showed large volume hemorrhage within the left temporal lobe. CT angiogram head and neck showed mild atherosclerotic changes without significant stenosis. MRI of the brain 09/08/2016 no evidence of mass vascular lesion or amyloid angiography. Hematoma size and mass effect stable from prior tracings. Echocardiogram with ejection fraction of 62% grade 2 diastolic dysfunction. Neurology consulted workup ongoing and continued monitoring. Tolerating a mechanical soft diet. Physical therapy evaluation completed 09/09/2016 with recommendations of physical medicine rehabilitation consult.   Review of Systems  Unable to perform ROS: Language   Past Medical History:  Diagnosis Date  . Essential hypertension    No past surgical history on file., unable to obtain from patient No family history on file., unable to obtain from patient Social History:  reports that she has never smoked. She has never used smokeless tobacco. She reports that she does not drink alcohol. Her drug history is not on file. Allergies:  Allergies  Allergen Reactions  . Amoxicillin Itching  . Oxycodone Itching  . Penicillin G Rash   Medications Prior to Admission  Medication Sig Dispense Refill  . levothyroxine (SYNTHROID, LEVOTHROID) 75 MCG tablet Take 75 mcg by mouth daily.    Marland Kitchen losartan  (COZAAR) 100 MG tablet Take 100 mg by mouth daily.    . mirtazapine (REMERON) 15 MG tablet Take 15 mg by mouth daily.    . simvastatin (ZOCOR) 20 MG tablet Take 20 mg by mouth daily.    . temazepam (RESTORIL) 15 MG capsule Take 15 mg by mouth at bedtime.      Home: Home Living Family/patient expects to be discharged to:: Private residence Living Arrangements: Spouse/significant other Available Help at Discharge: Family Type of Home: House Home Access: Stairs to enter Technical brewer of Steps: 3-4 Entrance Stairs-Rails: Right Home Layout: Two level, Laundry or work area in basement Alternate Therapist, sports of Steps: office in basement- 1 flight Alternate Level Stairs-Rails: Right Home Equipment: Environmental consultant - 2 wheels, Wheelchair - manual Additional Comments: Equipment is for husband. Pt is caregiver for spouse "Jeneen Rinks" who is total care per brother (present in room).  Functional History: Prior Function Level of Independence: Independent Comments: Drives, cooks, cleans, cares for husband. Brother, Edd Arbour, present in room states she does everything for her spouse. Functional Status:  Mobility: Bed Mobility General bed mobility comments: Up in chair upon PT arrival.  Transfers Overall transfer level: Needs assistance Equipment used: None Transfers: Sit to/from Stand General transfer comment: Min guard for safety as pt unsteady ins tanding with some sway noted. Pt reaching behind therapist's back for support. Ambulation/Gait Ambulation/Gait assistance: Min assist Ambulation Distance (Feet): 80 Feet Assistive device: 1 person hand held assist Gait Pattern/deviations: Step-through pattern, Decreased stride length General Gait Details: Slow, mildly unsteady gait with pt holding around therapist's waist for support. Constant Min A for balance. VSS. Gait velocity: decreased    ADL:    Cognition: Cognition Overall Cognitive  Status: Difficult to assess (Able to state name  correctly with cues.) Orientation Level: Other (comment) (global aphasia) Cognition Arousal/Alertness: Awake/alert Behavior During Therapy: WFL for tasks assessed/performed Overall Cognitive Status: Difficult to assess (Able to state name correctly with cues.) Area of Impairment: Following commands Following Commands: Follows one step commands with increased time, Follows multi-step commands consistently, Follows one step commands inconsistently General Comments: Does great with demonstration and imitation; seems to have more receptive >expressive aphasia but difficult to tell.  Difficult to assess due to: Impaired communication  Blood pressure (!) 145/64, pulse 78, temperature 98.5 F (36.9 C), temperature source Oral, resp. rate 16, height _0  (1.626 m), weight 49.9 kg (110 lb 0.2 oz), SpO2 100 %. Physical Exam  Vitals reviewed. Constitutional: She appears well-developed and well-nourished.  HENT:  Head: Normocephalic and atraumatic.  Eyes: Conjunctivae and EOM are normal.  Neck: Normal range of motion. Neck supple. No thyromegaly present.  Cardiovascular: Normal rate and regular rhythm.   Respiratory: Effort normal and breath sounds normal. No respiratory distress.  GI: Soft. Bowel sounds are normal. She exhibits no distension.  Musculoskeletal: She exhibits no edema or tenderness.  Neurological: She is alert.  Expressive and receptive aphasia.  She does provide some simple phrases. Moving all 4 extremities against gravity.  Skin: Skin is warm and dry.  Psychiatric:  Unable to assess due to mentation    Results for orders placed or performed during the hospital encounter of 09/07/16 (from the past 24 hour(s))  Sodium     Status: None   Collection Time: 09/08/16  1:58 PM  Result Value Ref Range   Sodium 139 135 - 145 mmol/L  Glucose, capillary     Status: None   Collection Time: 09/08/16  4:25 PM  Result Value Ref Range   Glucose-Capillary 81 65 - 99 mg/dL  Sodium      Status: None   Collection Time: 09/08/16  6:10 PM  Result Value Ref Range   Sodium 136 135 - 145 mmol/L  Glucose, capillary     Status: None   Collection Time: 09/08/16  7:01 PM  Result Value Ref Range   Glucose-Capillary 91 65 - 99 mg/dL   Comment 1 Notify RN   Glucose, capillary     Status: Abnormal   Collection Time: 09/08/16 11:16 PM  Result Value Ref Range   Glucose-Capillary 105 (H) 65 - 99 mg/dL  Sodium     Status: None   Collection Time: 09/08/16 11:34 PM  Result Value Ref Range   Sodium 138 135 - 145 mmol/L  Glucose, capillary     Status: None   Collection Time: 09/09/16  5:42 AM  Result Value Ref Range   Glucose-Capillary 87 65 - 99 mg/dL  Comprehensive metabolic panel     Status: Abnormal   Collection Time: 09/09/16  5:57 AM  Result Value Ref Range   Sodium 136 135 - 145 mmol/L   Potassium 3.1 (L) 3.5 - 5.1 mmol/L   Chloride 105 101 - 111 mmol/L   CO2 22 22 - 32 mmol/L   Glucose, Bld 91 65 - 99 mg/dL   BUN 11 6 - 20 mg/dL   Creatinine, Ser 0.74 0.44 - 1.00 mg/dL   Calcium 8.4 (L) 8.9 - 10.3 mg/dL   Total Protein 5.9 (L) 6.5 - 8.1 g/dL   Albumin 3.3 (L) 3.5 - 5.0 g/dL   AST 20 15 - 41 U/L   ALT 13 (L) 14 - 54 U/L  Alkaline Phosphatase 34 (L) 38 - 126 U/L   Total Bilirubin 0.9 0.3 - 1.2 mg/dL   GFR calc non Af Amer >60 >60 mL/min   GFR calc Af Amer >60 >60 mL/min   Anion gap 9 5 - 15  CBC     Status: Abnormal   Collection Time: 09/09/16  5:57 AM  Result Value Ref Range   WBC 5.8 4.0 - 10.5 K/uL   RBC 3.51 (L) 3.87 - 5.11 MIL/uL   Hemoglobin 10.8 (L) 12.0 - 15.0 g/dL   HCT 31.3 (L) 36.0 - 46.0 %   MCV 89.2 78.0 - 100.0 fL   MCH 30.8 26.0 - 34.0 pg   MCHC 34.5 30.0 - 36.0 g/dL   RDW 13.1 11.5 - 15.5 %   Platelets 188 150 - 400 K/uL  Vitamin B12     Status: Abnormal   Collection Time: 09/09/16  5:57 AM  Result Value Ref Range   Vitamin B-12 2,034 (H) 180 - 914 pg/mL  Lipid panel     Status: Abnormal   Collection Time: 09/09/16  5:57 AM  Result Value  Ref Range   Cholesterol 176 0 - 200 mg/dL   Triglycerides 62 <150 mg/dL   HDL 51 >40 mg/dL   Total CHOL/HDL Ratio 3.5 RATIO   VLDL 12 0 - 40 mg/dL   LDL Cholesterol 113 (H) 0 - 99 mg/dL  TSH     Status: None   Collection Time: 09/09/16  5:57 AM  Result Value Ref Range   TSH 1.741 0.350 - 4.500 uIU/mL  Glucose, capillary     Status: Abnormal   Collection Time: 09/09/16  8:39 AM  Result Value Ref Range   Glucose-Capillary 136 (H) 65 - 99 mg/dL  Glucose, capillary     Status: Abnormal   Collection Time: 09/09/16 11:57 AM  Result Value Ref Range   Glucose-Capillary 104 (H) 65 - 99 mg/dL   Comment 1 Notify RN    Comment 2 Document in Chart    Ct Angio Head W Or Wo Contrast  Result Date: 09/07/2016 CLINICAL DATA:  Left temporal lobe hemorrhage. EXAM: CT ANGIOGRAPHY HEAD AND NECK TECHNIQUE: Multidetector CT imaging of the head and neck was performed using the standard protocol during bolus administration of intravenous contrast. Multiplanar CT image reconstructions and MIPs were obtained to evaluate the vascular anatomy. Carotid stenosis measurements (when applicable) are obtained utilizing NASCET criteria, using the distal internal carotid diameter as the denominator. CONTRAST:  50 mL Isovue 370 COMPARISON:  CT head without contrast from the same day. FINDINGS: CT HEAD FINDINGS Brain: Left temporal lobe parenchymal hemorrhage is again seen. Mass effect is not significantly changed. CTA NECK FINDINGS Aortic arch: A 3 vessel arch configuration is present. Minimal atherosclerotic changes are present without a significant stenosis at the level of the arch. Right carotid system: The right common carotid artery is within normal limits. Atherosclerotic calcifications are present at the right carotid bifurcation without a significant stenosis. There is mild tortuosity of the cervical right ICA without significant stenosis. Left carotid system: The left common carotid artery is within normal limits. Minimal  calcifications are present at the carotid bifurcation without a significant stenosis. Mild tortuosity is present within the cervical left ICA. There is slight irregularity of the mid cervical left ICA. Vertebral arteries: The vertebral arteries both originate from the subclavian arteries. The left vertebral artery is the dominant vessel is. The vertebral arteries are within normal limits throughout the neck. Skeleton: Multilevel spondylosis  is present throughout the cervical spine. Degenerative anterolisthesis is present at C3-4, C4-5, and C5-6. There is chronic loss of height at C6-7. Multilevel facet degenerative changes are noted. No focal lytic or blastic lesions. Other neck: No focal mucosal or submucosal lesions are present. The thyroid is within normal limits. Salivary glands are unremarkable. No significant cervical adenopathy is present. Upper chest: The lung apices are clear. There is mild scarring at both lung apices. The superior mediastinum is otherwise unremarkable. Review of the MIP images confirms the above findings CTA HEAD FINDINGS Anterior circulation: Atherosclerotic calcifications are present within the cavernous internal carotid arteries bilaterally without a significant stenosis through the ICA termini. The A1 and M1 segments are normal. The anterior communicating artery is patent. MCA bifurcations are intact. ACA and MCA branch vessels are within normal limits. The hemorrhage the displaces vessels within the left temporal lobe. There is no focal mass lesion to explain the hemorrhage. Posterior circulation: A left PICA origin is below the dura. The left vertebral artery is dominant. Vertebrobasilar junction is within normal limits. Both posterior cerebral arteries originate from the basilar tip. PCA branch vessels are within normal limits bilaterally. Venous sinuses: The dural sinuses are patent. The right transverse sinus is dominant. The straight sinus and deep cerebral veins are intact.  Cortical veins are unremarkable. Anatomic variants: None. Delayed phase: The postcontrast images demonstrate no pathologic enhancement. Review of the MIP images confirms the above findings IMPRESSION: 1. Focal enhancing mass lesion or vascular abnormality to explain the left temporal hemorrhage. 2. Mild atherosclerotic changes at the carotid bifurcations bilaterally without significant stenosis. 3. Mild atherosclerotic changes at the aortic arch without significant stenosis. 4. Irregularity of the cervical left internal carotid artery raises the possibility of fibromuscular dysplasia. 5. Tortuosity of the cervical internal carotid arteries bilaterally likely resulting from chronic hypertension. Electronically Signed   By: San Morelle M.D.   On: 09/07/2016 20:47   Ct Head Wo Contrast  Result Date: 09/07/2016 CLINICAL DATA:  Altered mental status.  Dizziness. EXAM: CT HEAD WITHOUT CONTRAST TECHNIQUE:?: TECHNIQUE:? Contiguous axial images were obtained from the base of the skull through the vertex without intravenous contrast. COMPARISON:  None. FINDINGS: Brain: A large hemorrhage within the left temporal lobe measures 6.2 x 2.0 x 2.3 cm (volume = 15 cm^3). This results in mass effect with effacement of the overlying sulci. No significant midline shift is present. Mild atrophy and white matter disease is present otherwise. The brainstem and cerebellum are within normal limits. Vascular: Atherosclerotic calcifications are present within the cavernous internal carotid arteries and at the dural margin of the vertebral arteries. There is no hyperdense vessel. Skull: The calvarium is intact. Sinuses/Orbits: A single posterior right ethmoid air cell is opacified. The remaining paranasal sinuses and mastoid air cells are clear. IMPRESSION: 1. Large volume hemorrhage within the left temporal lobe. This is an atypical location for hemorrhage. Underlying mass is not excluded. Significant mass effect is present as  described. There is no midline shift or herniation. Right follow-up MRI of the brain without and with contrast in 2-3 weeks after initial blood products have partially cleared. 2. Mild atrophy and white matter disease otherwise. 3. Atherosclerosis. Electronically Signed   By: San Morelle M.D.   On: 09/07/2016 18:34   Ct Angio Neck W Or Wo Contrast  Result Date: 09/07/2016 CLINICAL DATA:  Left temporal lobe hemorrhage. EXAM: CT ANGIOGRAPHY HEAD AND NECK TECHNIQUE: Multidetector CT imaging of the head and neck was performed using the standard  protocol during bolus administration of intravenous contrast. Multiplanar CT image reconstructions and MIPs were obtained to evaluate the vascular anatomy. Carotid stenosis measurements (when applicable) are obtained utilizing NASCET criteria, using the distal internal carotid diameter as the denominator. CONTRAST:  50 mL Isovue 370 COMPARISON:  CT head without contrast from the same day. FINDINGS: CT HEAD FINDINGS Brain: Left temporal lobe parenchymal hemorrhage is again seen. Mass effect is not significantly changed. CTA NECK FINDINGS Aortic arch: A 3 vessel arch configuration is present. Minimal atherosclerotic changes are present without a significant stenosis at the level of the arch. Right carotid system: The right common carotid artery is within normal limits. Atherosclerotic calcifications are present at the right carotid bifurcation without a significant stenosis. There is mild tortuosity of the cervical right ICA without significant stenosis. Left carotid system: The left common carotid artery is within normal limits. Minimal calcifications are present at the carotid bifurcation without a significant stenosis. Mild tortuosity is present within the cervical left ICA. There is slight irregularity of the mid cervical left ICA. Vertebral arteries: The vertebral arteries both originate from the subclavian arteries. The left vertebral artery is the dominant  vessel is. The vertebral arteries are within normal limits throughout the neck. Skeleton: Multilevel spondylosis is present throughout the cervical spine. Degenerative anterolisthesis is present at C3-4, C4-5, and C5-6. There is chronic loss of height at C6-7. Multilevel facet degenerative changes are noted. No focal lytic or blastic lesions. Other neck: No focal mucosal or submucosal lesions are present. The thyroid is within normal limits. Salivary glands are unremarkable. No significant cervical adenopathy is present. Upper chest: The lung apices are clear. There is mild scarring at both lung apices. The superior mediastinum is otherwise unremarkable. Review of the MIP images confirms the above findings CTA HEAD FINDINGS Anterior circulation: Atherosclerotic calcifications are present within the cavernous internal carotid arteries bilaterally without a significant stenosis through the ICA termini. The A1 and M1 segments are normal. The anterior communicating artery is patent. MCA bifurcations are intact. ACA and MCA branch vessels are within normal limits. The hemorrhage the displaces vessels within the left temporal lobe. There is no focal mass lesion to explain the hemorrhage. Posterior circulation: A left PICA origin is below the dura. The left vertebral artery is dominant. Vertebrobasilar junction is within normal limits. Both posterior cerebral arteries originate from the basilar tip. PCA branch vessels are within normal limits bilaterally. Venous sinuses: The dural sinuses are patent. The right transverse sinus is dominant. The straight sinus and deep cerebral veins are intact. Cortical veins are unremarkable. Anatomic variants: None. Delayed phase: The postcontrast images demonstrate no pathologic enhancement. Review of the MIP images confirms the above findings IMPRESSION: 1. Focal enhancing mass lesion or vascular abnormality to explain the left temporal hemorrhage. 2. Mild atherosclerotic changes at the  carotid bifurcations bilaterally without significant stenosis. 3. Mild atherosclerotic changes at the aortic arch without significant stenosis. 4. Irregularity of the cervical left internal carotid artery raises the possibility of fibromuscular dysplasia. 5. Tortuosity of the cervical internal carotid arteries bilaterally likely resulting from chronic hypertension. Electronically Signed   By: San Morelle M.D.   On: 09/07/2016 20:47   [name] ZO Contrast  Result Date: 09/08/2016 CLINICAL DATA:  Hemorrhagic stroke. EXAM: MRI HEAD WITHOUT AND WITH CONTRAST TECHNIQUE: Multiplanar, multiecho pulse sequences of the brain and surrounding structures were obtained without and with intravenous contrast. CONTRAST:  37m MULTIHANCE GADOBENATE DIMEGLUMINE 529 MG/ML IV SOLN COMPARISON:  Head CT and  CTA from yesterday FINDINGS: Brain: Accounting for cross modality differences there is an unchanged acute parenchymal hemorrhage involving most of the left temporal lobe which measures up to 60 x 20 x 25 mm. Some peripheral and central methemoglobin is present, with intrinsic T1 hyperintensity. No superimposed enhancement with contrast. No evidence of underlying infarct. There is a moderate degree of surrounding vasogenic edema. Partial effacement of the left lateral ventricle is stable, no midline shift. No concerning vascular enhancement around the hemorrhage. Mild local dural accentuation is likely reactive. No generalized chronic blood products. There is a background of chronic microvascular disease that is moderate for age, with confluent rim of hyperintensity around the lateral ventricles. Vascular: Preserved flow voids, including dural venous sinuses. Skull and upper cervical spine: Negative Sinuses/Orbits: Right cataract resection.  No acute finding. Other: Motion degraded scan which could obscure subtle pathology. IMPRESSION: No specific explanation for the large left temporal hematoma. No evidence of mass,  vascular lesion, or amyloid angiography. Hematoma size and mass effect is stable from yesterday. Electronically Signed   By: Monte Fantasia M.D.   On: 09/08/2016 16:30    Assessment/Plan: Diagnosis: Acute left intraparenchymal hemorrhage Labs and images independently reviewed.  Records reviewed and summated above. Stroke: Continue secondary stroke prophylaxis and Risk Factor Modification listed below:   Blood Pressure Management:  Continue current medication with prn's with permisive HTN per primary team  1. Does the need for close, 24 hr/day medical supervision in concert with the patient's rehab needs make it unreasonable for this patient to be served in a less intensive setting? Yes  2. Co-Morbidities requiring supervision/potential complications:  grade 2 diastolic dysfunction (monitor for signs/symptoms of fluid overload), diabetes mellitus (Monitor in accordance with exercise and adjust meds as necessary), multiple myeloma, essential hypertension (monitor and provide prns in accordance with increased physical exertion and pain), Tachycardia (monitor in accordance with pain and increasing activity), hypothyroidism (cont meds, ensure appropriate mood and energy level for therapies), hypokalemia (continue to monitor and replete as necessary), hypoalbuminemia (maximize nutrition for overall health and wound healing), ABLA (transfuse if necessary to ensure appropriate perfusion for increased activity tolerance) 3. Due to bladder management, bowel management, safety, skin/wound care, disease management, medication administration and patient education, does the patient require 24 hr/day rehab nursing? Yes 4. Does the patient require coordinated care of a physician, rehab nurse, PT (1-2 hrs/day, 5 days/week), OT (1-2 hrs/day, 5 days/week) and SLP (1-2 hrs/day, 5 days/week) to address physical and functional deficits in the context of the above medical diagnosis(es)? Yes Addressing deficits in the  following areas: balance, endurance, locomotion, strength, transferring, bowel/bladder control, bathing, dressing, grooming, toileting, cognition, speech, language, swallowing and psychosocial support 5. Can the patient actively participate in an intensive therapy program of at least 3 hrs of therapy per day at least 5 days per week? Yes 6. The potential for patient to make measurable gains while on inpatient rehab is excellent 7. Anticipated functional outcomes upon discharge from inpatient rehab are modified independent and supervision  with PT, modified independent and supervision with OT, min assist and mod assist with SLP. 8. Estimated rehab length of stay to reach the above functional goals is: 15-19 days. 9. Does the patient have adequate social supports and living environment to accommodate these discharge functional goals? No 10. Anticipated D/C setting: SNF 11. Anticipated post D/C treatments: SNF 12. Overall Rehab/Functional Prognosis: good and fair  RECOMMENDATIONS: This patient's condition is appropriate for continued rehabilitative care in the following setting: Pt does  not appear to have caregiver support at discharge as she is the primary care taker for her husband.  Would recommend SNF unless cogntion improves. Patient has agreed to participate in recommended program. Potentially Note that insurance prior authorization may be required for reimbursement for recommended care.  Comment: Rehab Admissions Coordinator to follow up.  Delice Lesch, MD, Mellody Drown 09/09/2016

## 2016-09-09 NOTE — Care Management Note (Signed)
Case Management Note  Patient Details  Name: Judith Henderson MRN: [id number] Date of Birth: 29-Dec-1929  Subjective/Objective:   Pt admitted on 09/07/16 s/p large volume hemorrhage involving the Lt temporal lobe.  PTA, pt resides at home with her spouse.  She apparently is the caregiver for her spouse.                  Action/Plan: PT/OT recommending CIR; other family members coming in from out of town to care for husband.  Will follow for discharge planning as pt progresses.    Expected Discharge Date:                  Expected Discharge Plan:  [place]  In-House Referral:     Discharge planning Services  CM Consult  Post Acute Care Choice:    Choice offered to:     DME Arranged:    DME Agency:     HH Arranged:    [place] Agency:     Status of Service:  In process, will continue to follow  If discussed at Long Length of Stay Meetings, dates discussed:    Additional Comments:  Ella Bodo, RN 09/09/2016, 5:06 PM

## 2016-09-09 NOTE — Evaluation (Signed)
Occupational Therapy Evaluation Patient Details Name: Judith Henderson MRN: [id number] DOB: [birth year] Today's Date: 09/09/2016    History of Present Illness Patient is a 82 y/o female with hx of essential HTN, CHF , DM, multiple myeloma presents with new onset of confusion and speech output abnormality. CT head- large volume hemorrhage involving the left temporal lobe with surrounding edema and mass effect.   Clinical Impression   Pt was independent and active prior to admission. Present with impaired communication and cognition and impaired standing balance interfering with ability to perform ADL and mobility. Recommending intensive rehab. Will follow acutely.    Follow Up Recommendations  CIR    Equipment Recommendations       Recommendations for Other Services       Precautions / Restrictions Precautions Precautions: Fall Precaution Comments: global aphasia Restrictions Weight Bearing Restrictions: No      Mobility Bed Mobility               General bed mobility comments: pt in chair  Transfers Overall transfer level: Needs assistance Equipment used: 1 person hand held assist Transfers: Sit to/from Stand Sit to Stand: Min guard         General transfer comment: pt with swaying in standing    Balance Overall balance assessment: Needs assistance Sitting-balance support: Feet supported;No upper extremity supported Sitting balance-Leahy Scale: Good     Standing balance support: During functional activity Standing balance-Leahy Scale: Fair Standing balance comment: Able to stand statically without UE support with sway but requires UE support for dynamic activities and walking.                            ADL Overall ADL's : Needs assistance/impaired Eating/Feeding: Set up;Sitting   Grooming: Wash/dry hands;Standing;Min guard;Cueing for sequencing (cues for soap)   Upper Body Bathing: Supervision/ safety;Sitting   Lower Body Bathing:  Minimal assistance;Sit to/from stand Lower Body Bathing Details (indicate cue type and reason): assist for balance Upper Body Dressing : Moderate assistance;Sitting Upper Body Dressing Details (indicate cue type and reason): front opening gown, assist to orient, pt able to tie once gown was on Lower Body Dressing: Minimal assistance;Sit to/from stand Lower Body Dressing Details (indicate cue type and reason): donned and doffed socks, assist for balance with standing  Toilet Transfer: Minimal assistance;Ambulation   Toileting- Clothing Manipulation and Hygiene: Minimal assistance;Sit to/from stand               Vision     Perception     Praxis      Pertinent Vitals/Pain Pain Assessment: Faces Faces Pain Scale: No hurt     Hand Dominance Right   Extremity/Trunk Assessment Upper Extremity Assessment Upper Extremity Assessment: Overall WFL for tasks assessed (arthritic changes in hands)   Lower Extremity Assessment Lower Extremity Assessment: Defer to PT evaluation       Communication Communication Communication: Receptive difficulties;Expressive difficulties   Cognition Arousal/Alertness: Awake/alert Behavior During Therapy: WFL for tasks assessed/performed Overall Cognitive Status: Difficult to assess (does not appear aware of deficits) Area of Impairment: Following commands       Following Commands: Follows one step commands with increased time;Follows multi-step commands consistently;Follows one step commands inconsistently       General Comments: Pt with perseveration with verbalization and when talking.   General Comments       Exercises       Shoulder Instructions      Home Living Family/patient  expects to be discharged to:: Private residence Living Arrangements: Spouse/significant other Available Help at Discharge: Family Type of Home: House Home Access: Stairs to enter [employer] of Steps: 3-4 Entrance Stairs-Rails: Right Home  Layout: Two level;Laundry or work area in Building surveyor of Steps: office in basement- 1 flight Alternate Level Stairs-Rails: Right Bathroom Shower/Tub: Walk-in shower         Home Equipment: Environmental consultant - 2 wheels;Wheelchair - manual   Additional Comments: Equipment is for husband. Pt is caregiver for spouse "Jeneen Rinks" who is total care per brother (present in room).      Prior Functioning/Environment Level of Independence: Independent        Comments: Drives, cooks, cleans, cares for husband. Brother, Edd Arbour, present in room states she does everything for her spouse.        OT Problem List: Impaired balance (sitting and/or standing);Decreased cognition;Decreased knowledge of use of DME or AE   OT Treatment/Interventions: Self-care/ADL training;Cognitive remediation/compensation;Balance training;Patient/family education;Therapeutic activities    OT Goals(Current goals can be found in the care plan section) Acute Rehab OT Goals Patient Stated Goal: none stated OT Goal Formulation: Patient unable to participate in goal setting Time For Goal Achievement: 09/23/16 Potential to Achieve Goals: Good ADL Goals Pt Will Perform Grooming: with supervision;standing Pt Will Transfer to Toilet: with supervision;ambulating;regular height toilet Pt Will Perform Toileting - Clothing Manipulation and hygiene: with supervision;sit to/from stand Additional ADL Goal #1: Pt will perform bathing and dressing with supervision. Additional ADL Goal #2: Pt will identify and gather necessary items to complete ADL with supervision.  OT Frequency: Min 3X/week   Barriers to D/C:            Co-evaluation              End of Session Equipment Utilized During Treatment: Gait belt  Activity Tolerance: Patient tolerated treatment well Patient left: in chair;with call bell/phone within reach;with chair alarm set;with family/visitor present   Time: 2162-4469 OT Time Calculation  (min): 18 min Charges:  OT General Charges $OT Visit: 1 Procedure OT Evaluation $OT Eval Moderate Complexity: 1 Procedure G-Codes:    Malka So 09/09/2016, 3:32 PM [phone]

## 2016-09-09 NOTE — Progress Notes (Signed)
Inpatient Rehabilitation  Patient was screened by Gerlean Ren for appropriateness for an Inpatient Acute Rehab consult.  At this time, we are recommending Inpatient Rehab consult.    [facility] Admissions Coordinator Cell [phone] Office [phone]

## 2016-09-09 NOTE — Progress Notes (Signed)
STROKE TEAM PROGRESS NOTE   SUBJECTIVE (INTERVAL HISTORY) Her family including her brothers and her husband are at the bedside.  Overall she is stable overnight and no acute issues. Passed swallow and on diet. Moving all extremities but still has global aphasia.    OBJECTIVE Temp:  [97.7 F (36.5 C)-98.5 F (36.9 C)] 98.5 F (36.9 C) (12/04 2113) Pulse Rate:  [62-97] 77 (12/04 2113) Cardiac Rhythm: Normal sinus rhythm (12/04 2000) Resp:  [11-20] 18 (12/04 2113) BP: (96-149)/(50-108) 140/63 (12/04 2113) SpO2:  [95 %-100 %] 98 % (12/04 2113) Weight:  [110 lb 0.2 oz (49.9 kg)] 110 lb 0.2 oz (49.9 kg) (12/04 0500)  CBC:   Recent Labs Lab 09/07/16 1802 09/09/16 0557  WBC 7.5 5.8  NEUTROABS 6.2  --   HGB 12.2 10.8*  HCT 35.8* 31.3*  MCV 89.7 89.2  PLT 193 938    Basic Metabolic Panel:   Recent Labs Lab 09/07/16 1802  09/08/16 2334 09/09/16 0557  NA 131*  < > 138 136  K 3.2*  --   --  3.1*  CL 96*  --   --  105  CO2 24  --   --  22  GLUCOSE 102*  --   --  91  BUN 13  --   --  11  CREATININE 0.88  --   --  0.74  CALCIUM 9.2  --   --  8.4*  < > = values in this interval not displayed.  Lipid Panel:     Component Value Date/Time   CHOL 176 09/09/2016 0557   TRIG 62 09/09/2016 0557   HDL 51 09/09/2016 0557   CHOLHDL 3.5 09/09/2016 0557   VLDL 12 09/09/2016 0557   LDLCALC 113 (H) 09/09/2016 0557   HgbA1c: No results found for: HGBA1C Urine Drug Screen: No results found for: LABOPIA, COCAINSCRNUR, LABBENZ, AMPHETMU, THCU, LABBARB    IMAGING I have personally reviewed the radiological images below and agree with the radiology interpretations.  Ct Angio Head and Neck W Or Wo Contrast 09/07/2016 1. Focal enhancing mass lesion or vascular abnormality to explain the left temporal hemorrhage.  2. Mild atherosclerotic changes at the carotid bifurcations bilaterally without significant stenosis.  3. Mild atherosclerotic changes at the aortic arch without significant  stenosis.  4. Irregularity of the cervical left internal carotid artery raises the possibility of fibromuscular dysplasia.  5. Tortuosity of the cervical internal carotid arteries bilaterally likely resulting from chronic hypertension.   Ct Head Wo Contrast 09/07/2016 1. Large volume hemorrhage within the left temporal lobe. This is an atypical location for hemorrhage. Underlying mass is not excluded. Significant mass effect is present as described. There is no midline shift or herniation. Recommend follow-up MRI of the brain without and with contrast in 2-3 weeks after initial blood products have partially cleared.  2. Mild atrophy and white matter disease otherwise.  3. Atherosclerosis.   [name] Wo Contrast 09/08/2016 IMPRESSION: No specific explanation for the large left temporal hematoma. No evidence of mass, vascular lesion, or amyloid angiography. Hematoma size and mass effect is stable from yesterday.  TTE  - Left ventricle: The cavity size was normal. Wall thickness was   normal. Systolic function was normal. The estimated ejection   fraction was in the range of 55% to 60%. Wall motion was normal;   there were no regional wall motion abnormalities. Features are   consistent with a pseudonormal left ventricular filling pattern,   with concomitant abnormal relaxation  and increased filling   pressure (grade 2 diastolic dysfunction).  PHYSICAL EXAM  Temp:  [97.7 F (36.5 C)-98.5 F (36.9 C)] 98.5 F (36.9 C) (12/04 2113) Pulse Rate:  [62-97] 77 (12/04 2113) Resp:  [11-20] 18 (12/04 2113) BP: (110-149)/(50-108) 140/63 (12/04 2113) SpO2:  [95 %-100 %] 98 % (12/04 2113) Weight:  [110 lb 0.2 oz (49.9 kg)] 110 lb 0.2 oz (49.9 kg) (12/04 0500)  General - Well nourished, well developed, in no apparent distress.  Ophthalmologic - Fundi not visualized due to noncooperation.  Cardiovascular - Regular rate and rhythm.  Neuro - awake alert, but global aphasia, receptive >  expressive. She did not follow commands, but able to pantomime and to have short sentences out with less than 4 words, otherwise word salad. No neglect. Eyes in the middle position, attending to both sides. PERRL. Right facial droop mild, tongue in the middle. Moving all extremities equally bilaterally. DTR 1+ and no babinski. Sensation, coordination and gait not tested due to noncooperation.    ASSESSMENT/PLAN [name] is a 82 y.o. female with history of hypertension and hyperlipidemia presenting with speech difficulties and confusion. She did not receive IV t-PA due to hemorrhagic stroke.  Hemorrhagic stroke:  left temporal lobe ICH. Etiology unclear, HTN vs. Brain met.   Resultant  Global aphasia, right facial droop  MRI with and without contrast - left ICH hemorrhage, no CAA or mass  CTA H&N - Focal enhancing mass lesion or vascular abnormality to explain the left temporal hemorrhage.  Irregularity of the cervical left internal carotid artery raises the possibility of fibromuscular dysplasia.   CT Head - Large volume hemorrhage within the left temporal lobe.   2D Echo - EF 55-60%  LDL - 113  HgbA1c  - pending  VTE prophylaxis -  SCDs DIET DYS 3 Room service appropriate? Yes; Fluid consistency: Thin  No antithrombotic prior to admission, now on No antithrombotic secondary to hemorrhage.  Ongoing aggressive stroke risk factor management  Therapy recommendations: SNF  Disposition:  Pending  Hypertension  BP stable - Cardene drip off  Current goal at <160 Labetalol PRN  Hyperlipidemia  Home meds: Zocor 20 mg daily resumed in hospital.  Continue statin at discharge  Other Stroke Risk Factors  Advanced age  Other Active Problems  Hypokalemia - 3.2, supplement   Hyponatremia - 131 -> 136  Hospital day # 2  This patient is critically ill due to large left brain ICH, HTN and at significant risk of neurological worsening, death form recurrent hemorrhage,  brain herniation and cerebral edema. This patient's care requires constant monitoring of vital signs, hemodynamics, respiratory and cardiac monitoring, review of multiple databases, neurological assessment, discussion with family, other specialists and medical decision making of high complexity. I spent 35 minutes of neurocritical care time in the care of this patient.  [name], MD PhD Stroke Neurology 09/09/2016 11:12 PM

## 2016-09-09 NOTE — Evaluation (Signed)
Speech Language Pathology Evaluation Patient Details Name: Judith Henderson MRN: [id number] DOB: Mar 21, 1930 Today's Date: 09/09/2016 Time: 8889-1694 SLP Time Calculation (min) (ACUTE ONLY): 15 min  Problem List:  Patient Active Problem List   Diagnosis Date Noted  . Hemorrhagic stroke ([place])   . Diastolic dysfunction   . Diabetes mellitus type 2 in nonobese (HCC)   . Multiple myeloma ([place])   . Benign essential HTN   . Tachycardia   . Hypothyroidism   . Hypokalemia   . Hypoalbuminemia due to protein-calorie malnutrition ([place])   . Acute blood loss anemia   . Essential hypertension   . ICH (intracerebral hemorrhage) ([place]) 09/07/2016   Past Medical History:  Past Medical History:  Diagnosis Date  . Diabetes mellitus type 2 in nonobese (HCC)   . Essential hypertension   . Multiple myeloma (HCC)    Past Surgical History: No past surgical history on file. HPI:  Judith Henderson an 82 y.o.femalewith ahistory hypertension, hyperlipidemia presenting with new onset of confusion and speech output abnormality. CT scan of her head showed a large volume hemorrhage involving the left temporal lobe with surrounding edema and mass effect.    Assessment / Plan / Recommendation Clinical Impression  Pt exhibits primarily Wernickes aphasia marked by decreased comprehension, fluent jargon with poor to no awareness of errors, semantic paraphasia's and dysgraphia. Educated/introduced strategis to assist pt's communication with family. Pt would benefit from intense ST to facilitate communicative abilities primarily.      SLP Assessment  Patient needs continued Speech Lanaguage Pathology Services    Follow Up Recommendations  Inpatient Rehab    Frequency and Duration min 2x/week  2 weeks      SLP Evaluation Cognition  Overall Cognitive Status: Impaired/Different from baseline Arousal/Alertness: Awake/alert Orientation Level: Oriented to person (need to assess w/ y/n ?'s for place, time  situation) Attention: Sustained Sustained Attention: Appears intact Memory:  (continue to assess in diagnostic treatment) Awareness: Impaired Awareness Impairment: Emergent impairment Problem Solving:  (functional for tasks visualized) Safety/Judgment: Impaired       Comprehension  Auditory Comprehension Overall Auditory Comprehension: Impaired Yes/No Questions: Impaired Basic Biographical Questions: 51-75% accurate Basic Immediate Environment Questions: 50-74% accurate Commands: Impaired One Step Basic Commands: 50-74% accurate (some motor apraxia) Visual Recognition/Discrimination Discrimination: Not tested Reading Comprehension Reading Status:  (TBA)    Expression Expression Primary Mode of Expression: Verbal Verbal Expression Overall Verbal Expression: Impaired Initiation: No impairment Level of Generative/Spontaneous Verbalization: Conversation Repetition: Impaired Level of Impairment: Word level Naming: Impairment Responsive: 0-25% accurate Confrontation: Impaired (0% independent) Convergent: Not tested Divergent: Not tested Verbal Errors: Semantic paraphasias;Not aware of errors;Jargon Pragmatics: No impairment Written Expression Dominant Hand: Right Written Expression: Exceptions to [facility] Dictation Ability: Letter   Oral / Motor  Oral Motor/Sensory Function Overall Oral Motor/Sensory Function: Moderate impairment Facial ROM: Reduced right;Suspected CN VII (facial) dysfunction Facial Symmetry: Abnormal symmetry right;Suspected CN VII (facial) dysfunction Facial Strength: Suspected CN VII (facial) dysfunction;Reduced right Facial Sensation: Reduced right Mandible: Within Functional Limits Motor Speech Respiration: Within functional limits Phonation: Normal Resonance: Within functional limits Articulation: Within functional limitis Intelligibility: Intelligible Motor Planning: Witnin functional limits   GO                    Judith Henderson 09/09/2016, 4:36 PM  Judith Henderson Judith Henderson M.Ed [employer] [phone]

## 2016-09-09 NOTE — Evaluation (Signed)
Physical Therapy Evaluation Patient Details Name: Judith Henderson MRN: [id number] DOB: 16-Jun-1930 Today's Date: 09/09/2016   History of Present Illness  Patient is a 82 y/o female with hx of essential HTN, CHF , DM, multiple myeloma presents with new onset of confusion and speech output abnormality. CT head- large volume hemorrhage involving the left temporal lobe with surrounding edema and mass effect.  Clinical Impression  Patient presents with impaired balance, global aphasia (receptive>expressive) and impaired mobility s/p above. Tolerated gait training with Min A for balance/safety. Pt is independent PTA and caregiver for spouse at home. Pt's brother present during session and assisted with PLOF due to language deficits. Pt does well with following commands using demonstration. Pt not safe to return home without support. Would benefit from CIR to maximize independence and mobility prior to return home.    Follow Up Recommendations CIR    Equipment Recommendations  Other (comment) (TBA)    Recommendations for Other Services Speech consult (Cognitive/language assessment.)     Precautions / Restrictions Precautions Precautions: Fall Precaution Comments: global aphasia Restrictions Weight Bearing Restrictions: No      Mobility  Bed Mobility               General bed mobility comments: Up in chair upon PT arrival.   Transfers Overall transfer level: Needs assistance Equipment used: None Transfers: Sit to/from Stand           General transfer comment: Min guard for safety as pt unsteady ins tanding with some sway noted. Pt reaching behind therapist's back for support.  Ambulation/Gait Ambulation/Gait assistance: Min assist Ambulation Distance (Feet): 80 Feet Assistive device: 1 person hand held assist Gait Pattern/deviations: Step-through pattern;Decreased stride length Gait velocity: decreased   General Gait Details: Slow, mildly unsteady gait with pt holding  around therapist's waist for support. Constant Min A for balance. VSS.  Stairs            Wheelchair Mobility    Modified Rankin (Stroke Patients Only) Modified Rankin (Stroke Patients Only) Pre-Morbid Rankin Score: No significant disability Modified Rankin: Moderately severe disability     Balance Overall balance assessment: Needs assistance Sitting-balance support: Feet supported;No upper extremity supported Sitting balance-Leahy Scale: Good     Standing balance support: During functional activity Standing balance-Leahy Scale: Fair Standing balance comment: Able to stand statically without UE support with sway but requires UE support for dynamic activities and walking.                             Pertinent Vitals/Pain Pain Assessment: No/denies pain    Home Living Family/patient expects to be discharged to:: Private residence Living Arrangements: Spouse/significant other Available Help at Discharge: Family Type of Home: House Home Access: Stairs to enter Entrance Stairs-Rails: Right Entrance Stairs-Number of Steps: 3-4 Home Layout: Two level;Laundry or work area in [place]: Environmental consultant - 2 wheels;Wheelchair - manual Additional Comments: Equipment is for husband. Pt is caregiver for spouse "Judith Henderson" who is total care per brother (present in room).    Prior Function Level of Independence: Independent         Comments: Drives, cooks, cleans, cares for husband. Brother, Judith Henderson, present in room states she does everything for her spouse.     Hand Dominance        Extremity/Trunk Assessment   Upper Extremity Assessment: Defer to OT evaluation           Lower Extremity Assessment: Overall [facility]  for tasks assessed         Communication   Communication: Receptive difficulties;Expressive difficulties  Cognition Arousal/Alertness: Awake/alert Behavior During Therapy: WFL for tasks assessed/performed Overall Cognitive Status:  Difficult to assess (Able to state name correctly with cues.) Area of Impairment: Following commands       Following Commands: Follows one step commands with increased time;Follows multi-step commands consistently;Follows one step commands inconsistently       General Comments: Does great with demonstration and imitation; seems to have more receptive >expressive aphasia but difficult to tell.     General Comments General comments (skin integrity, edema, etc.): Brother, Judith Henderson, present during session.    Exercises     Assessment/Plan    PT Assessment Patient needs continued PT services  PT Problem List Decreased mobility;Decreased cognition;Decreased balance;Decreased activity tolerance          PT Treatment Interventions DME instruction;Therapeutic activities;Gait training;Therapeutic exercise;Stair training;Balance training;Neuromuscular re-education;Patient/family education    PT Goals (Current goals can be found in the Care Plan section)  Acute Rehab PT Goals Patient Stated Goal: none stated PT Goal Formulation: Patient unable to participate in goal setting Time For Goal Achievement: 09/23/16 Potential to Achieve Goals: Fair    Frequency Min 3X/week   Barriers to discharge Decreased caregiver support cares for spouse at home    Co-evaluation               End of Session Equipment Utilized During Treatment: Gait belt Activity Tolerance: Patient tolerated treatment well Patient left: in chair;with call bell/phone within reach;with family/visitor present;with chair alarm set Nurse Communication: Mobility status         Time: 1135-1153 PT Time Calculation (min) (ACUTE ONLY): 18 min   Charges:   PT Evaluation $PT Eval Moderate Complexity: 1 Procedure     PT G Codes:        Judith Henderson 09/09/2016, 12:26 PM Wray Kearns, [place], DPT [phone]

## 2016-09-09 NOTE — Progress Notes (Signed)
Speech Language Pathology Treatment: Dysphagia  Patient Details Name: Judith Henderson MRN: [id number] DOB: [birth year] Today's Date: 09/09/2016 Time: [id number] SLP Time Calculation (min) (ACUTE ONLY): 15 min  Assessment / Plan / Recommendation Clinical Impression  Brief observation with thin with and without straws (possible upgrade) without s/s aspiration. Adequate sip size and able to tolerate using straw. Pt finished lunch recently and unable to observe with solids. Recommend continue Dys 3, thin liquids, straws allowed, pills whole in applesauce and probable upgrade to regular soon.     HPI HPI: Judith Henderson an 82 y.o.femalewith ahistory hypertension, hyperlipidemia presenting with new onset of confusion and speech output abnormality. CT scan of her head showed a large volume hemorrhage involving the left temporal lobe with surrounding edema and mass effect.       SLP Plan  Continue with current plan of care     Recommendations  Diet recommendations: Dysphagia 3 (mechanical soft);Thin liquid Liquids provided via: Straw;Cup Medication Administration: Whole meds with puree Supervision: Patient able to self feed;Intermittent supervision to cue for compensatory strategies Compensations: Slow rate;Small sips/bites Postural Changes and/or Swallow Maneuvers: Seated upright 90 degrees                General recommendations: Rehab consult Oral Care Recommendations: Oral care BID Follow up Recommendations: Inpatient Rehab Plan: Continue with current plan of care       GO                Houston Siren 09/09/2016, 4:06 PM   Orbie Pyo Colvin Caroli.Ed [employer] [phone]

## 2016-09-10 DIAGNOSIS — I611 Nontraumatic intracerebral hemorrhage in hemisphere, cortical: Principal | ICD-10-CM

## 2016-09-10 LAB — GLUCOSE, CAPILLARY
GLUCOSE-CAPILLARY: 114 mg/dL — AB (ref 65–99)
GLUCOSE-CAPILLARY: 115 mg/dL — AB (ref 65–99)
GLUCOSE-CAPILLARY: 111 mg/dL — AB (ref 65–99)

## 2016-09-10 LAB — BASIC METABOLIC PANEL
Anion gap: 10 (ref 5–15)
BUN: 13 mg/dL (ref 6–20)
CHLORIDE: 102 mmol/L (ref 101–111)
CO2: 24 mmol/L (ref 22–32)
CREATININE: 0.9 mg/dL (ref 0.44–1.00)
Calcium: 9.1 mg/dL (ref 8.9–10.3)
GFR, EST NON AFRICAN AMERICAN: 57 mL/min — AB (ref >60)
Glucose, Bld: 115 mg/dL — ABNORMAL HIGH (ref 65–99)
POTASSIUM: 3.2 mmol/L — AB (ref 3.5–5.1)
SODIUM: 136 mmol/L (ref 135–145)

## 2016-09-10 LAB — CBC
HCT: 31.7 % — ABNORMAL LOW (ref 36.0–46.0)
HEMOGLOBIN: 11 g/dL — AB (ref 12.0–15.0)
MCH: 30.6 pg (ref 26.0–34.0)
MCHC: 34.7 g/dL (ref 30.0–36.0)
MCV: 88.1 fL (ref 78.0–100.0)
Platelets: 182 10*3/uL (ref 150–400)
RBC: 3.6 MIL/uL — AB (ref 3.87–5.11)
RDW: 12.8 % (ref 11.5–15.5)
WBC: 5.3 10*3/uL (ref 4.0–10.5)

## 2016-09-10 LAB — HEMOGLOBIN A1C
Hgb A1c MFr Bld: 5.2 % (ref 4.8–5.6)
MEAN PLASMA GLUCOSE: 103 mg/dL

## 2016-09-10 MED ORDER — HALOPERIDOL LACTATE 5 MG/ML IJ SOLN
1.0000 mg | Freq: Once | INTRAMUSCULAR | Status: AC
  Administered 2016-09-10: 1 mg via INTRAVENOUS
  Filled 2016-09-10: qty 1

## 2016-09-10 MED ORDER — TRAZODONE HCL 100 MG PO TABS
100.0000 mg | ORAL_TABLET | Freq: Once | ORAL | Status: AC
  Administered 2016-09-10: 100 mg via ORAL
  Filled 2016-09-10: qty 1

## 2016-09-10 NOTE — Progress Notes (Signed)
STROKE TEAM PROGRESS NOTE   SUBJECTIVE (INTERVAL HISTORY) Her family including her brothers and daughter-in-law are at the bedside.  Overall she is stable overnight and no acute issues. Sitting in chair during round. Still has partial global aphasia, but able to follow some midline commands. PT/OT recommend SNF.    OBJECTIVE Temp:  [97.6 F (36.4 C)-98.3 F (36.8 C)] 97.6 F (36.4 C) (12/05 2131) Pulse Rate:  [76-90] 90 (12/05 2131) Cardiac Rhythm: Normal sinus rhythm (12/05 0700) Resp:  [20] 20 (12/05 2131) BP: (122-161)/(61-74) 161/66 (12/05 2131) SpO2:  [98 %-100 %] 99 % (12/05 2131)  CBC:   Recent Labs Lab 09/07/16 1802 09/09/16 0557 09/10/16 0404  WBC 7.5 5.8 5.3  NEUTROABS 6.2  --   --   HGB 12.2 10.8* 11.0*  HCT 35.8* 31.3* 31.7*  MCV 89.7 89.2 88.1  PLT 193 188 397    Basic Metabolic Panel:   Recent Labs Lab 09/09/16 0557 09/10/16 0404  NA 136 136  K 3.1* 3.2*  CL 105 102  CO2 22 24  GLUCOSE 91 115*  BUN 11 13  CREATININE 0.74 0.90  CALCIUM 8.4* 9.1    Lipid Panel:     Component Value Date/Time   CHOL 176 09/09/2016 0557   TRIG 62 09/09/2016 0557   HDL 51 09/09/2016 0557   CHOLHDL 3.5 09/09/2016 0557   VLDL 12 09/09/2016 0557   LDLCALC 113 (H) 09/09/2016 0557   HgbA1c:  Lab Results  Component Value Date   HGBA1C 5.2 09/09/2016   Urine Drug Screen: No results found for: LABOPIA, COCAINSCRNUR, LABBENZ, AMPHETMU, THCU, LABBARB    IMAGING I have personally reviewed the radiological images below and agree with the radiology interpretations.  Ct Angio Head and Neck W Or Wo Contrast 09/07/2016 1. Focal enhancing mass lesion or vascular abnormality to explain the left temporal hemorrhage.  2. Mild atherosclerotic changes at the carotid bifurcations bilaterally without significant stenosis.  3. Mild atherosclerotic changes at the aortic arch without significant stenosis.  4. Irregularity of the cervical left internal carotid artery raises the  possibility of fibromuscular dysplasia.  5. Tortuosity of the cervical internal carotid arteries bilaterally likely resulting from chronic hypertension.   Ct Head Wo Contrast 09/07/2016 1. Large volume hemorrhage within the left temporal lobe. This is an atypical location for hemorrhage. Underlying mass is not excluded. Significant mass effect is present as described. There is no midline shift or herniation. Recommend follow-up MRI of the brain without and with contrast in 2-3 weeks after initial blood products have partially cleared.  2. Mild atrophy and white matter disease otherwise.  3. Atherosclerosis.   [name] Wo Contrast 09/08/2016 IMPRESSION: No specific explanation for the large left temporal hematoma. No evidence of mass, vascular lesion, or amyloid angiography. Hematoma size and mass effect is stable from yesterday.  TTE  - Left ventricle: The cavity size was normal. Wall thickness was   normal. Systolic function was normal. The estimated ejection   fraction was in the range of 55% to 60%. Wall motion was normal;   there were no regional wall motion abnormalities. Features are   consistent with a pseudonormal left ventricular filling pattern,   with concomitant abnormal relaxation and increased filling   pressure (grade 2 diastolic dysfunction).  PHYSICAL EXAM  Temp:  [97.6 F (36.4 C)-98.3 F (36.8 C)] 97.6 F (36.4 C) (12/05 2131) Pulse Rate:  [76-90] 90 (12/05 2131) Resp:  [20] 20 (12/05 2131) BP: (122-161)/(61-74) 161/66 (12/05 2131) SpO2:  [  98 %-100 %] 99 % (12/05 2131)  General - Well nourished, well developed, in no apparent distress.  Ophthalmologic - Fundi not visualized due to noncooperation.  Cardiovascular - Regular rate and rhythm.  Neuro - awake alert, but global aphasia, improving, able to follow midline commands but not peripheral commands.  No neglect. Eyes in the middle position, attending to both sides. PERRL. Right facial droop mild, tongue in  the middle. Moving all extremities equally bilaterally. DTR 1+ and no babinski. Sensation, coordination and gait not tested due to noncooperation.    ASSESSMENT/PLAN Judith Henderson is a 82 y.o. female with history of hypertension and hyperlipidemia presenting with speech difficulties and confusion. She did not receive IV t-PA due to hemorrhagic stroke.  Hemorrhagic stroke:  left temporal lobe ICH. Etiology unclear, HTN vs. Brain met.   Resultant  Global aphasia, right facial droop  MRI with and without contrast - left ICH hemorrhage, no CAA or mass  CTA H&N - Focal enhancing mass lesion or vascular abnormality to explain the left temporal hemorrhage.  Irregularity of the cervical left internal carotid artery raises the possibility of fibromuscular dysplasia. No left transverse sinus thrombosis   CT Head - Large volume hemorrhage within the left temporal lobe.   2D Echo - EF 55-60%  LDL - 113  HgbA1c  - 5.2  VTE prophylaxis -  SCDs Diet regular Room service appropriate? Yes; Fluid consistency: Thin  No antithrombotic prior to admission, now on No antithrombotic secondary to hemorrhage.  Ongoing aggressive stroke risk factor management  Therapy recommendations: SNF  Disposition:  Pending  Hypertension  BP stable - Cardene drip off  Current goal at <160 Labetalol PRN  Hyperlipidemia  Home meds: Zocor 20 mg daily resumed in hospital.  Continue statin at discharge  Other Stroke Risk Factors  Advanced age  Other Active Problems  Hypokalemia - 3.2, supplement   Hyponatremia - 131 -> 136  Hospital day # 3   Judith Hawking, MD PhD Stroke Neurology 09/10/2016 10:19 PM

## 2016-09-10 NOTE — Progress Notes (Signed)
Pt continues to be agitated and restless. Order given for Trazadone and Posey vest. Will continue to monitor.

## 2016-09-10 NOTE — Progress Notes (Signed)
Physical Therapy Treatment Patient Details Name: Judith Henderson MRN: [id number] DOB: 12/03/1929 Today's Date: 09/10/2016    History of Present Illness Patient is a 82 y/o female with hx of essential HTN, CHF , DM, multiple myeloma presents with new onset of confusion and speech output abnormality. CT head- large volume hemorrhage involving the left temporal lobe with surrounding edema and mass effect.    PT Comments    Patient continues to demonstrate global aphasia and impaired balance/mobility. Pt appeared to respond to more verbal commands this session although still needing demonstration of tasks ~50% of time. Family present in room during session. Pt continues to require min A for safe OOB mobility. Continue to progress as tolerated.   Follow Up Recommendations  CIR     Equipment Recommendations  Rolling walker with 5" wheels    Recommendations for Other Services       Precautions / Restrictions Precautions Precautions: Fall Precaution Comments: global aphasia    Mobility  Bed Mobility               General bed mobility comments: pt OOB in chair upon arrival  Transfers Overall transfer level: Needs assistance Equipment used: None Transfers: Sit to/from Stand Sit to Stand: Min guard         General transfer comment: min guard for safety to stand and min A upon standing for balance; pt with sway in standing but no LOB in static stand  Ambulation/Gait Ambulation/Gait assistance: Min assist Ambulation Distance (Feet): 120 Feet Assistive device: 1 person hand held assist Gait Pattern/deviations: Step-through pattern;Decreased stride length Gait velocity: decreased   General Gait Details: assist for balance; pt with slight drifting/staggering R and L but no LOB; gait disturbance noted with horizontal head turns and increased time needed for changing directions/turning   Stairs            Wheelchair Mobility    Modified Rankin (Stroke Patients Only)        Balance     Sitting balance-Leahy Scale: Good       Standing balance-Leahy Scale: Fair Standing balance comment: static stand                    Cognition Arousal/Alertness: Awake/alert Behavior During Therapy: WFL for tasks assessed/performed Overall Cognitive Status: Difficult to assess (Able to state name correctly with cues.) Area of Impairment: Following commands       Following Commands: Follows one step commands with increased time;Follows multi-step commands consistently;Follows one step commands inconsistently       General Comments: Does great with demonstration and imitation; seems to have more receptive >expressive aphasia but difficult to tell.     Exercises      General Comments General comments (skin integrity, edema, etc.): pt responsive to verbal cues about 50% time and required demonstration of tasks the rest of time; family present in room      Pertinent Vitals/Pain Pain Assessment: No/denies pain    Home Living                      Prior Function            PT Goals (current goals can now be found in the care plan section) Acute Rehab PT Goals Patient Stated Goal: none stated Progress towards PT goals: Progressing toward goals    Frequency    Min 3X/week      PT Plan Current plan remains appropriate    Co-evaluation  End of Session Equipment Utilized During Treatment: Gait belt Activity Tolerance: Patient tolerated treatment well Patient left: in chair;with call bell/phone within reach;with chair alarm set;with family/visitor present     Time: 8346-2194 PT Time Calculation (min) (ACUTE ONLY): 17 min  Charges:  $Gait Training: 8-22 mins                    G Codes:      Salina April, PTA Pager: [phone]   09/10/2016, 3:54 PM

## 2016-09-10 NOTE — Progress Notes (Signed)
Pt is toileted and helped to bed with posey belt safely  in place. Bed is in lowest position. Bed alarm on.

## 2016-09-10 NOTE — Progress Notes (Signed)
Pt is unchanged after medication ordered, seroquel. MD called.

## 2016-09-10 NOTE — NC FL2 (Signed)
[place] LEVEL OF CARE SCREENING TOOL     IDENTIFICATION  Patient Name: Judith Henderson Birthdate: 01-08-1930 Sex: female Admission Date (Current Location): 09/07/2016  [facility] and [place] Number:  Herbalist and Address:  [facility] [place] [address] [place], [place] [address]      Provider Number: [id number]  Attending Physician Name and Address:  [name], MD  Relative Name and Phone Number:       Current Level of Care: Hospital Recommended Level of Care: [place] Prior Approval Number:    Date Approved/Denied:   PASRR Number:   [id number]  Discharge Plan: SNF    Current Diagnoses: Patient Active Problem List   Diagnosis Date Noted  . Hemorrhagic stroke ([place])   . Diastolic dysfunction   . Diabetes mellitus type 2 in nonobese (HCC)   . Multiple myeloma ([place])   . Benign essential HTN   . Tachycardia   . Hypothyroidism   . Hypokalemia   . Hypoalbuminemia due to protein-calorie malnutrition ([place])   . Acute blood loss anemia   . Essential hypertension   . ICH (intracerebral hemorrhage) ([place]) 09/07/2016    Orientation RESPIRATION BLADDER Height & Weight     Self  Normal Continent Weight: 110 lb 0.2 oz (49.9 kg) Height:  '5\' 4"'  (162.6 cm)  BEHAVIORAL SYMPTOMS/MOOD NEUROLOGICAL BOWEL NUTRITION STATUS  Other (Comment) (confused, tearful, frustrated)   Continent Diet (Diet recommendations: Dysphagia 3 (mechanical soft);Thin liquid)  Liquids provided via: Straw;Cup Medication Administration: Whole meds with puree Supervision: Patient able to self feed;Intermittent supervision to cue for compensatory strategies Compensations: Slow rate;Small sips/bites Postural Changes and/or Swallow Maneuvers: Seated upright 90 degrees  AMBULATORY STATUS COMMUNICATION OF NEEDS Skin   Limited Assist Verbally (difficult to understand, secondary to admission dx) Normal                       Personal Care  Assistance Level of Assistance  Bathing, Feeding, Dressing Bathing Assistance: Limited assistance Feeding assistance: Limited assistance Dressing Assistance: Limited assistance     Functional Limitations Info  Sight, Hearing, Speech Sight Info: Adequate Hearing Info: Adequate Speech Info: Impaired    SPECIAL CARE FACTORS FREQUENCY  PT (By licensed PT), OT (By licensed OT), Speech therapy, Restorative feeding program     PT Frequency: 5x OT Frequency: 5x     Speech Therapy Frequency: 3x      Contractures Contractures Info: Not present    Additional Factors Info  Code Status, Allergies Code Status Info: Full Code Allergies Info: Amoxicillin, Oxycodone, Penicillin G           Current Medications (09/10/2016):  This is the current hospital active medication list Current Facility-Administered Medications  Medication Dose Route Frequency Provider Last Rate Last Dose  . acetaminophen (TYLENOL) tablet 650 mg  650 mg Oral Q4H PRN Wallie Char      . bisacodyl (DULCOLAX) suppository 10 mg  10 mg Rectal Daily PRN David L Rinehuls, PA-C      . insulin aspart (novoLOG) injection 0-9 Units  0-9 Units Subcutaneous Q6H David L Rinehuls, PA-C      . labetalol (NORMODYNE,TRANDATE) injection 10-20 mg  10-20 mg Intravenous Q2H PRN [name], MD      . levothyroxine (SYNTHROID, LEVOTHROID) tablet 75 mcg  75 mcg Oral QAC breakfast [name], MD   75 mcg at 09/10/16 3524  . losartan (COZAAR) tablet 100 mg  100 mg Oral Daily Jindong  Erlinda Hong, MD   100 mg at 09/10/16 1014  . pantoprazole (PROTONIX) EC tablet 40 mg  40 mg Oral Daily [name], MD   40 mg at 09/10/16 1013  . senna-docusate (Senokot-S) tablet 1 tablet  1 tablet Oral BID Wallie Char   1 tablet at 09/10/16 1013  . simvastatin (ZOCOR) tablet 20 mg  20 mg Oral q1800 [name], MD   20 mg at 09/09/16 1751     Discharge Medications: Please see discharge summary for a list of discharge medications.  Relevant Imaging  Results:  Relevant Lab Results:   Additional Information SSN:  [id number]    [place], [place]

## 2016-09-10 NOTE — Clinical Social Work Note (Signed)
Clinical Social Work Assessment  Patient Details  Name: Judith Henderson MRN: [id number] Date of Birth: 1930/06/16  Date of referral:  09/10/16               Reason for consult:  Facility Placement, Discharge Planning                Permission sought to share information with:  Case Manager, Customer service manager, Family Supports Permission granted to share information::  Yes, Verbal Permission Granted  Name::        Agency::     Relationship::  Brother: Production designer, theatre/television/film Information:     Housing/Transportation Living arrangements for the past 2 months:  Single Family Home Source of Information:  Patient, Medical Team, Case Manager, Other (Comment Required) (brother) Patient Interpreter Needed:  None Criminal Activity/Legal Involvement Pertinent to Current Situation/Hospitalization:  No - Comment as needed Significant Relationships:  Other Family Members, Spouse Lives with:  Spouse (is the main caregiver for her disabled spouse) Do you feel safe going back to the place where you live?  No Need for family participation in patient care:  Yes (Comment)  Care giving concerns:  LCSW attempted to speak with patient, but she is very confused and restless. Currently in restraints (waist) due to restless behavior and trying to get up/fall risk.  Patient is from home, lives with her disabled husband whom she is the main caregiver for.  She has had a CNA come into the home and help patient with his baths and cleaning 2-3 times a week per her brother Edd Arbour.  At this time she is unable to care for herself nor her husband.  Edd Arbour reports he has his family coming in from out of town to stay with spouse through the weekend.  Long plan for spouse will be to arrange private care to live in the home as they have space in the basement.  Edd Arbour feels patient would need ST SNF to stabilize before coming home and there is no other family at home to assist.  He is in agreement with SNF in [place],  [place], [place] area.   Social Worker assessment / plan:  LCSW explained reason for consult: facility placement and educated Ronnie via phone on role and services while in the hospital. Edd Arbour reports he is overwhelmed with situation as his wife just had surgery at [facility] and in a SNF currently.  Edd Arbour is acting as the POA for patient and working to address her bills and balances with the bank through the community.  There is no POA in place, deferring to brother as spouse is not able to manage or make decisions.  He depends solely on his wife as a caregiver.  Plan is SNF at DC and LCSW has completed work up and will follow up with [facility] regarding bed offers.  Employment status:  Retired Nurse, adult PT Recommendations:  [place] / Referral to community resources:  [place]  Patient/Family's Response to care:  Understanding and involved with care.  Agreeable with recommendations  Patient/Family's Understanding of and Emotional Response to Diagnosis, Current Treatment, and Prognosis:  Ronnie voices being overwhelmed and acting as decision maker for his sister. He understands her limitations and agreeable to assist with dc planning and treatment while in hospital.  Emotional Assessment Appearance:  Appears stated age Attitude/Demeanor/Rapport:  Other (pleasantly confused) Affect (typically observed):  Pleasant, Restless Orientation:  Oriented to Self Alcohol / Substance use:  Not Applicable Psych  involvement (Current and /or in the community):  No (Comment)  Discharge Needs  Concerns to be addressed:  Cognitive Concerns, Care Coordination Readmission within the last 30 days:  No Current discharge risk:  None Barriers to Discharge:  Continued Medical Work up, [employer]   Marshell Garfinkel 09/10/2016, 12:44 PM

## 2016-09-10 NOTE — Progress Notes (Addendum)
LCSW attempted to meet with patient however she is quite confused, but pleasant. Patient remains in restraints due to confusion and safety. LCSW called brother Edd Arbour to discuss DC plans and learn more about patient. Patient does confirm her brother is involved and his name is [place]. Patient has traditional Medicare in which requires authorization for SNF.  Reports she lives in [place] and [place] does not have a car.  Currently recommendations remains SNF due to lack of care giving and unable to dc home for safety. LCSW has begun work up for SNF and will continue to try and reach family.  Lane Hacker, MSW Clinical Social Work: [employer] Coverage for :  [phone]

## 2016-09-10 NOTE — Progress Notes (Addendum)
Pt wants to leave and go home. She is crying and will not stay in bed. MD called. Pt is placed at the nursing station

## 2016-09-10 NOTE — Progress Notes (Signed)
Rehab admissions - Please see rehab consult recommending SNF due to lack of caregiver support.  Patient was the caregiver for her husband.  Likely will need SNF at discharge.  Patient is confused telling me that she intends to discharge home directly since husband needs her.  Call me for questions.  [phone]

## 2016-09-10 NOTE — Progress Notes (Signed)
Pt is able to maneuver out of restraints. Pt confused   Nurse at bedside. Charge nurse reports no sitter available.

## 2016-09-10 NOTE — Progress Notes (Signed)
Speech Language Pathology Treatment: Cognitive-Linquistic;Dysphagia  Patient Details Name: Palmira Hirschi MRN: [id number] DOB: 19-Aug-1930 Today's Date: 09/10/2016 Time: 1133-1200 SLP Time Calculation (min) (ACUTE ONLY): 27 min  Assessment / Plan / Recommendation Clinical Impression  Skilled treatment session focused on addressing dysphagia and communication goals. SLP facilitated session by providing set-up assist and skilled observation of lunch tray of Dys.3 textures and thin liquids as well as advanced, regular textures.  Patient with coughing and sneeze x1 likely due to pepper seasoning.  Patient with spontaneous sentence level verbal expression with intermittent paraphasic errors at the word-phrase level.  Patient required Max assist verbal and non-verbal cues for trace emerging awareness of verbal errors.  During a structured object naming task patient required Max assist sentence completion and phonemic cues for accuracy in 40% of opportunities.  Recommend diet advancement to regular textures with continuation of thin liquids and intermittent supervision.       HPI HPI: Eve Elkind an 82 y.o.femalewith ahistory hypertension, hyperlipidemia presenting with new onset of confusion and speech output abnormality. CT scan of her head showed a large volume hemorrhage involving the left temporal lobe with surrounding edema and mass effect.       SLP Plan  Continue with current plan of care     Recommendations  Diet recommendations: Regular;Thin liquid Liquids provided via: Straw;Cup Medication Administration: Whole meds with puree Supervision: Patient able to self feed;Intermittent supervision to cue for compensatory strategies Compensations: Slow rate;Small sips/bites Postural Changes and/or Swallow Maneuvers: Seated upright 90 degrees                Oral Care Recommendations: Oral care BID Follow up Recommendations: Inpatient Rehab Plan: Continue with current plan of  care       GO              Carmelia Roller., CCC-SLP [id number]  [place] 09/10/2016, 12:13 PM

## 2016-09-11 LAB — CBC
HCT: 30 % — ABNORMAL LOW (ref 36.0–46.0)
Hemoglobin: 10.2 g/dL — ABNORMAL LOW (ref 12.0–15.0)
MCH: 30.5 pg (ref 26.0–34.0)
MCHC: 34 g/dL (ref 30.0–36.0)
MCV: 89.8 fL (ref 78.0–100.0)
PLATELETS: 177 10*3/uL (ref 150–400)
RBC: 3.34 MIL/uL — ABNORMAL LOW (ref 3.87–5.11)
RDW: 13.5 % (ref 11.5–15.5)
WBC: 6.2 10*3/uL (ref 4.0–10.5)

## 2016-09-11 LAB — GLUCOSE, CAPILLARY
GLUCOSE-CAPILLARY: 91 mg/dL (ref 65–99)
GLUCOSE-CAPILLARY: 128 mg/dL — AB (ref 65–99)
Glucose-Capillary: 94 mg/dL (ref 65–99)
Glucose-Capillary: 99 mg/dL (ref 65–99)

## 2016-09-11 LAB — BASIC METABOLIC PANEL
ANION GAP: 11 (ref 5–15)
BUN: 12 mg/dL (ref 6–20)
CALCIUM: 8.8 mg/dL — AB (ref 8.9–10.3)
CO2: 20 mmol/L — ABNORMAL LOW (ref 22–32)
Chloride: 107 mmol/L (ref 101–111)
Creatinine, Ser: 0.79 mg/dL (ref 0.44–1.00)
GLUCOSE: 98 mg/dL (ref 65–99)
Potassium: 3.3 mmol/L — ABNORMAL LOW (ref 3.5–5.1)
Sodium: 138 mmol/L (ref 135–145)

## 2016-09-11 NOTE — Care Management Important Message (Signed)
Important Message  Patient Details  Name: Judith Henderson MRN: [id number] Date of Birth: 1930-01-14   Medicare Important Message Given:  Yes    [name] 09/11/2016, 11:00 AM

## 2016-09-11 NOTE — Progress Notes (Signed)
Speech Language Pathology Treatment: Dysphagia  Patient Details Name: Orlando Thalmann MRN: [id number] DOB: 05-05-1930 Today's Date: 09/11/2016 Time: 7494-4967 SLP Time Calculation (min) (ACUTE ONLY): 10 min  Assessment / Plan / Recommendation Clinical Impression  Pt observed consuming breakfast - No indication of airway compromise nor oral residuals with intake.  Pt able to feed herself adequately - but may benefit from set up assistance.  Recommend continue regular/thin diet - no follow up indicated regarding swallowing function as pt tolerating well.    HPI HPI: Latecia Miler an 82 y.o.femalewith ahistory hypertension, hyperlipidemia presenting with new onset of confusion and speech output abnormality. CT scan of her head showed a large volume hemorrhage involving the left temporal lobe with surrounding edema and mass effect.       SLP Plan   (met re: swallowing ability)     Recommendations  Diet recommendations: Regular;Thin liquid Liquids provided via: Cup;Straw Medication Administration: Whole meds with puree Supervision: Patient able to self feed Compensations: Slow rate;Small sips/bites Postural Changes and/or Swallow Maneuvers: Seated upright 90 degrees;Upright 30-60 min after meal                Oral Care Recommendations: Oral care BID Follow up Recommendations:  (no follow up re: swallowing) Plan:  (met re: swallowing ability)       [place], [place] [facility] SLP [phone]

## 2016-09-11 NOTE — Progress Notes (Signed)
Speech Language Pathology Treatment: Cognitive-Linquistic  Patient Details Name: Judith Henderson MRN: [id number] DOB: [birth year] Today's Date: 09/11/2016 Time: 9971-8209 SLP Time Calculation (min) (ACUTE ONLY): 26 min  Assessment / Plan / Recommendation Clinical Impression  Return session to focus on expressive/receptive language and cognitive skills.  SlP facilitated session by use of verbal, visual, phoneme placement and hand over hand assist to maximize function.  Pt noted with stereotypical utterances "That will be good" and paraphasias including "tilley, filley".   Pt able to identify correct word from choice of 2 with 4/7 correct, 3/7 incorrect identifying her name or objects.  Pt able to repeat single words with total assist.  Automatic speech tasks including counting - 100% accuracy - and phrase completion achieved with 65% accuracy.  Appropriate social utterances of "thank you", "hi", "come in" noted.  Pt comprehension is severely impaired even with one step directions hand over hand or with use of visual cues pt completed with total assist.  Awareness of verbal errors was 10% today at best.    Pt will continue to benefit from aggressive SlP for language/cognition to maximize her communication abilities.     HPI HPI: Judith Henderson an 82 y.o.femalewith ahistory hypertension, hyperlipidemia presenting with new onset of confusion and speech output abnormality. CT scan of her head showed a large volume hemorrhage involving the left temporal lobe with surrounding edema and mass effect.       SLP Plan  Continue with current plan of care (met re: swallowing ability)     Recommendations  Diet recommendations: Regular;Thin liquid Liquids provided via: Cup;Straw Medication Administration: Whole meds with puree Supervision: Patient able to self feed Compensations: Slow rate;Small sips/bites Postural Changes and/or Swallow Maneuvers: Seated upright 90 degrees;Upright 30-60 min after meal                 Oral Care Recommendations: Oral care BID Follow up Recommendations: Skilled Nursing facility (no follow up re: swallowing) Plan: Continue with current plan of care (met re: swallowing ability)       GO                Claudie Fisherman, [facility] [place]

## 2016-09-12 LAB — CBC
HEMATOCRIT: 32.1 % — AB (ref 36.0–46.0)
HEMOGLOBIN: 10.8 g/dL — AB (ref 12.0–15.0)
MCH: 30.3 pg (ref 26.0–34.0)
MCHC: 33.6 g/dL (ref 30.0–36.0)
MCV: 89.9 fL (ref 78.0–100.0)
Platelets: 214 10*3/uL (ref 150–400)
RBC: 3.57 MIL/uL — ABNORMAL LOW (ref 3.87–5.11)
RDW: 13.5 % (ref 11.5–15.5)
WBC: 5.3 10*3/uL (ref 4.0–10.5)

## 2016-09-12 LAB — GLUCOSE, CAPILLARY
GLUCOSE-CAPILLARY: 150 mg/dL — AB (ref 65–99)
Glucose-Capillary: 82 mg/dL (ref 65–99)
Glucose-Capillary: 90 mg/dL (ref 65–99)

## 2016-09-12 LAB — BASIC METABOLIC PANEL
ANION GAP: 7 (ref 5–15)
BUN: 14 mg/dL (ref 6–20)
CALCIUM: 9 mg/dL (ref 8.9–10.3)
CHLORIDE: 106 mmol/L (ref 101–111)
CO2: 26 mmol/L (ref 22–32)
Creatinine, Ser: 0.87 mg/dL (ref 0.44–1.00)
GFR calc Af Amer: >60 mL/min (ref >60)
GFR calc non Af Amer: 59 mL/min — ABNORMAL LOW (ref >60)
GLUCOSE: 98 mg/dL (ref 65–99)
Potassium: 3.5 mmol/L (ref 3.5–5.1)
Sodium: 139 mmol/L (ref 135–145)

## 2016-09-12 MED ORDER — POTASSIUM CHLORIDE CRYS ER 20 MEQ PO TBCR
40.0000 meq | EXTENDED_RELEASE_TABLET | ORAL | Status: AC
  Administered 2016-09-12: 40 meq via ORAL
  Filled 2016-09-12: qty 2

## 2016-09-12 NOTE — Progress Notes (Signed)
Occupational Therapy Treatment Patient Details Name: Judith Henderson MRN: [id number] DOB: 05-17-1930 Today's Date: 09/12/2016    History of present illness Patient is a 82 y/o female with hx of essential HTN, CHF , DM, multiple myeloma presents with new onset of confusion and speech output abnormality. CT head- large volume hemorrhage involving the left temporal lobe with surrounding edema and mass effect.   OT comments  Pt performed ADL at sink and dressing with min assist. Used hand held assist to ambulate to sink and RW to chair. Pt responds well to visual cues.   Follow Up Recommendations  SNF;Supervision/Assistance - 24 hour    Equipment Recommendations       Recommendations for Other Services      Precautions / Restrictions Precautions Precautions: Fall Precaution Comments: global aphasia       Mobility Bed Mobility Overal bed mobility: Modified Independent             General bed mobility comments: HOB up  Transfers Overall transfer level: Needs assistance Equipment used: 1 person hand held assist;Rolling walker (2 wheeled) Transfers: Sit to/from Stand Sit to Stand: Min assist         General transfer comment: reached for OTs hand to steady    Balance Overall balance assessment: Needs assistance   Sitting balance-Leahy Scale: Good       Standing balance-Leahy Scale: Fair                     ADL Overall ADL's : Needs assistance/impaired     Grooming: Wash/dry hands;Wash/dry face;Oral care;Standing;Minimal assistance Grooming Details (indicate cue type and reason): assist to set place toothpaste on toothbrush         Upper Body Dressing : Set up Upper Body Dressing Details (indicate cue type and reason): like to tie gown Lower Body Dressing: Supervision/safety;Sitting/lateral leans Lower Body Dressing Details (indicate cue type and reason): socks at EOB Toilet Transfer: Supervision/safety;RW;Ambulation Toilet Transfer Details (indicate  cue type and reason): simulated to chair         Functional mobility during ADLs: Rolling walker;Min guard (min with one hand held) General ADL Comments: stood x 10 minutes, very thorough      Vision                     Perception     Praxis      Cognition   Behavior During Therapy: WFL for tasks assessed/performed Overall Cognitive Status: Difficult to assess Area of Impairment: Following commands;Safety/judgement        Following Commands: Follows one step commands with increased time (visual and verbal cues) Safety/Judgement: Decreased awareness of deficits          Extremity/Trunk Assessment               Exercises     Shoulder Instructions       General Comments      Pertinent Vitals/ Pain       Pain Assessment: Faces Faces Pain Scale: No hurt  Home Living                                          Prior Functioning/Environment              Frequency  Min 2X/week        Progress Toward Goals  OT Goals(current goals can now be  found in the care plan section)  Progress towards OT goals: Progressing toward goals  Acute Rehab OT Goals Patient Stated Goal: none stated Time For Goal Achievement: 09/23/16 Potential to Achieve Goals: Good  Plan Discharge plan needs to be updated;Frequency needs to be updated    Co-evaluation                 End of Session Equipment Utilized During Treatment: Gait belt;Rolling walker   Activity Tolerance Patient tolerated treatment well   Patient Left in chair;with call bell/phone within reach;with chair alarm set   Nurse Communication          Time: 7290-2111 OT Time Calculation (min): 20 min  Charges: OT General Charges $OT Visit: 1 Procedure OT Treatments $Self Care/Home Management : 8-22 mins  Malka So 09/12/2016, 10:35 AM  [phone]

## 2016-09-12 NOTE — Care Management Note (Signed)
Case Management Note  Patient Details  Name: Judith Henderson MRN: [id number] Date of Birth: October 19, 1929  Subjective/Objective:                    Action/Plan: Plan is SNF at discharge. CM spoke to patients brother, her POA, this am and confirmed the plan is for SNF. CM following.  Expected Discharge Date:                  Expected Discharge Plan:  [place]  In-House Referral:     Discharge planning Services  CM Consult  Post Acute Care Choice:    Choice offered to:     DME Arranged:    DME Agency:     HH Arranged:    [place] Agency:     Status of Service:  In process, will continue to follow  If discussed at Long Length of Stay Meetings, dates discussed:    Additional Comments:  Pollie Friar, RN 09/12/2016, 3:20 PM

## 2016-09-12 NOTE — Progress Notes (Signed)
STROKE TEAM PROGRESS NOTE   SUBJECTIVE (INTERVAL HISTORY) Her family including her husband and two nephews are at the bedside.  Overall she is stable overnight and no acute issues. Sitting in bed during round. Still has partial global aphasia, SW working on SNF placement.    OBJECTIVE Temp:  [97.6 F (36.4 C)-98.6 F (37 C)] 98.1 F (36.7 C) (12/06 2126) Pulse Rate:  [57-79] 79 (12/06 2126) Resp:  [17-20] 18 (12/06 2126) BP: (126-174)/(53-77) 130/62 (12/06 2126) SpO2:  [98 %-100 %] 100 % (12/06 2126)  CBC:   Recent Labs Lab 09/07/16 1802  09/10/16 0404 09/11/16 0340  WBC 7.5  < > 5.3 6.2  NEUTROABS 6.2  --   --   --   HGB 12.2  < > 11.0* 10.2*  HCT 35.8*  < > 31.7* 30.0*  MCV 89.7  < > 88.1 89.8  PLT 193  < > 182 177  < > = values in this interval not displayed.  Basic Metabolic Panel:   Recent Labs Lab 09/10/16 0404 09/11/16 0340  NA 136 138  K 3.2* 3.3*  CL 102 107  CO2 24 20*  GLUCOSE 115* 98  BUN 13 12  CREATININE 0.90 0.79  CALCIUM 9.1 8.8*    Lipid Panel:     Component Value Date/Time   CHOL 176 09/09/2016 0557   TRIG 62 09/09/2016 0557   HDL 51 09/09/2016 0557   CHOLHDL 3.5 09/09/2016 0557   VLDL 12 09/09/2016 0557   LDLCALC 113 (H) 09/09/2016 0557   HgbA1c:  Lab Results  Component Value Date   HGBA1C 5.2 09/09/2016   Urine Drug Screen: No results found for: LABOPIA, COCAINSCRNUR, LABBENZ, AMPHETMU, THCU, LABBARB    IMAGING I have personally reviewed the radiological images below and agree with the radiology interpretations.  Ct Angio Head and Neck W Or Wo Contrast 09/07/2016 1. Focal enhancing mass lesion or vascular abnormality to explain the left temporal hemorrhage.  2. Mild atherosclerotic changes at the carotid bifurcations bilaterally without significant stenosis.  3. Mild atherosclerotic changes at the aortic arch without significant stenosis.  4. Irregularity of the cervical left internal carotid artery raises the possibility of  fibromuscular dysplasia.  5. Tortuosity of the cervical internal carotid arteries bilaterally likely resulting from chronic hypertension.   Ct Head Wo Contrast 09/07/2016 1. Large volume hemorrhage within the left temporal lobe. This is an atypical location for hemorrhage. Underlying mass is not excluded. Significant mass effect is present as described. There is no midline shift or herniation. Recommend follow-up MRI of the brain without and with contrast in 2-3 weeks after initial blood products have partially cleared.  2. Mild atrophy and white matter disease otherwise.  3. Atherosclerosis.   [name] Wo Contrast 09/08/2016 IMPRESSION: No specific explanation for the large left temporal hematoma. No evidence of mass, vascular lesion, or amyloid angiography. Hematoma size and mass effect is stable from yesterday.  TTE  - Left ventricle: The cavity size was normal. Wall thickness was   normal. Systolic function was normal. The estimated ejection   fraction was in the range of 55% to 60%. Wall motion was normal;   there were no regional wall motion abnormalities. Features are   consistent with a pseudonormal left ventricular filling pattern,   with concomitant abnormal relaxation and increased filling   pressure (grade 2 diastolic dysfunction).  PHYSICAL EXAM  Temp:  [97.6 F (36.4 C)-98.6 F (37 C)] 98.1 F (36.7 C) (12/06 2126) Pulse Rate:  [  57-79] 79 (12/06 2126) Resp:  [17-20] 18 (12/06 2126) BP: (126-174)/(53-77) 130/62 (12/06 2126) SpO2:  [98 %-100 %] 100 % (12/06 2126)  General - Well nourished, well developed, in no apparent distress.  Ophthalmologic - Fundi not visualized due to noncooperation.  Cardiovascular - Regular rate and rhythm.  Neuro - awake alert, but global aphasia, improving, able to follow midline commands but not peripheral commands.  No neglect. Eyes in the middle position, attending to both sides. PERRL. Right facial droop mild, tongue in the middle.  Moving all extremities equally bilaterally. DTR 1+ and no babinski. Sensation, coordination and gait not tested due to noncooperation.    ASSESSMENT/PLAN Ms. Newell Frater is a 82 y.o. female with history of hypertension and hyperlipidemia presenting with speech difficulties and confusion. She did not receive IV t-PA due to hemorrhagic stroke.  Hemorrhagic stroke:  left temporal lobe ICH. Etiology unclear, HTN vs. Brain met.   Resultant  Global aphasia, right facial droop  MRI with and without contrast - left ICH hemorrhage, no CAA or mass  CTA H&N - Focal enhancing mass lesion or vascular abnormality to explain the left temporal hemorrhage.  Irregularity of the cervical left internal carotid artery raises the possibility of fibromuscular dysplasia. No left transverse sinus thrombosis   CT Head - Large volume hemorrhage within the left temporal lobe.   2D Echo - EF 55-60%  LDL - 113  HgbA1c  - 5.2  VTE prophylaxis -  SCDs Diet regular Room service appropriate? Yes; Fluid consistency: Thin  No antithrombotic prior to admission, now on No antithrombotic secondary to hemorrhage.  Ongoing aggressive stroke risk factor management  Therapy recommendations: SNF  Disposition:  Pending  Hypertension  BP stable - Cardene drip off  Current goal at <160 Labetalol PRN  Hyperlipidemia  Home meds: Zocor 20 mg daily resumed in hospital.  Continue statin at discharge  Other Stroke Risk Factors  Advanced age  Other Active Problems  Hypokalemia - 3.2, supplement   Hyponatremia - 131 -> 136  Hospital day # 5   [name], MD PhD Stroke Neurology 09/12/2016 12:07 AM

## 2016-09-12 NOTE — Clinical Social Work Note (Signed)
CSW spoke to pt's brother as there are no current bed offers. CSW inquired if the bed search could be expanded. Pt's brother in agreement. Pt's brother inquired about [facility] in [place]. CSW will follow up with facility, as well as additional bed offers. CSW will continue to follow.   Darden Dates, MSW, LCSW Clinical Social Worker  [phone]

## 2016-09-12 NOTE — Clinical Social Work Note (Signed)
CSW presented bed offers. Pt's brother chose [facility]. CSW notified facility about bed choice and to [employer]. CSW was notified by [facility] that they do not manage pt's [employer] plan, therefore the facility needs to initiate the [place]. CSW will continue to follow.   Darden Dates, MSW, LCSW  Clinical Social Worker  [phone]

## 2016-09-12 NOTE — Progress Notes (Signed)
Physical Therapy Treatment Patient Details Name: Kashmere Daywalt MRN: [id number] DOB: 24-Jan-1930 Today's Date: 09/12/2016    History of Present Illness Patient is a 82 y/o female with hx of essential HTN, CHF , DM, multiple myeloma presents with new onset of confusion and speech output abnormality. CT head- large volume hemorrhage involving the left temporal lobe with surrounding edema and mass effect.    PT Comments    Facilitated higher level balance challenges with 1 UE assist.  Gait training in hallway with RW for increased steadiness.  D/c plan switched to SNF as CIR has declined pt.  Follow Up Recommendations  SNF     Equipment Recommendations  Rolling walker with 5" wheels    Recommendations for Other Services       Precautions / Restrictions Precautions Precautions: Fall Precaution Comments: global aphasia    Mobility  Bed Mobility Overal bed mobility: Modified Independent             General bed mobility comments: Pt up in chair upon arrival  Transfers Overall transfer level: Needs assistance Equipment used: Rolling walker (2 wheeled) Transfers: Sit to/from Stand Sit to Stand: Min assist         General transfer comment: cueing for hand placement  Ambulation/Gait Ambulation/Gait assistance: Min assist Ambulation Distance (Feet): 150 Feet Assistive device: Rolling walker (2 wheeled) Gait Pattern/deviations: Step-through pattern     General Gait Details: Steadier with RW for hallway ambulation.  In room worked on gait with 1 HHA and higher balance challenges   Stairs            Wheelchair Mobility    Modified Rankin (Stroke Patients Only) Modified Rankin (Stroke Patients Only) Pre-Morbid Rankin Score: No significant disability Modified Rankin: Moderately severe disability     Balance Overall balance assessment: Needs assistance   Sitting balance-Leahy Scale: Good       Standing balance-Leahy Scale: Fair               High  level balance activites: Other (comment) (Marching and SLS) High Level Balance Comments: difficulty with instructions due to aphasia    Cognition Arousal/Alertness: Awake/alert Behavior During Therapy: WFL for tasks assessed/performed Overall Cognitive Status: Difficult to assess Area of Impairment: Following commands;Safety/judgement       Following Commands: Follows one step commands with increased time Safety/Judgement: Decreased awareness of deficits          Exercises      General Comments        Pertinent Vitals/Pain Pain Assessment: Faces Faces Pain Scale: No hurt    Home Living                      Prior Function            PT Goals (current goals can now be found in the care plan section) Acute Rehab PT Goals Patient Stated Goal: none stated Time For Goal Achievement: 09/23/16 Potential to Achieve Goals: Good Progress towards PT goals: Progressing toward goals    Frequency    Min 3X/week      PT Plan Discharge plan needs to be updated    Co-evaluation             End of Session Equipment Utilized During Treatment: Gait belt Activity Tolerance: Patient tolerated treatment well Patient left: in chair;with call bell/phone within reach;with chair alarm set     Time: 1048-1101 PT Time Calculation (min) (ACUTE ONLY): 13 min  Charges:  $Gait Training:  8-22 mins                    G Codes:      Jamy Whyte LUBECK 09/12/2016, 11:10 AM

## 2016-09-12 NOTE — Progress Notes (Addendum)
STROKE TEAM PROGRESS NOTE   SUBJECTIVE (INTERVAL HISTORY) Her brother and sister in law are at the bedside.  Overall she is stable overnight and no acute issues. Sitting in bed during round. Still has partial global aphasia, SW working on SNF placement.    OBJECTIVE Temp:  [97.9 F (36.6 C)-99 F (37.2 C)] 98.4 F (36.9 C) (12/07 1800) Pulse Rate:  [76-84] 83 (12/07 1800) Resp:  [16-20] 19 (12/07 1800) BP: (127-136)/(57-67) 136/57 (12/07 1800) SpO2:  [98 %-100 %] 98 % (12/07 1800)  CBC:   Recent Labs Lab 09/07/16 1802  09/11/16 0340 09/12/16 0410  WBC 7.5  < > 6.2 5.3  NEUTROABS 6.2  --   --   --   HGB 12.2  < > 10.2* 10.8*  HCT 35.8*  < > 30.0* 32.1*  MCV 89.7  < > 89.8 89.9  PLT 193  < > 177 214  < > = values in this interval not displayed.  Basic Metabolic Panel:   Recent Labs Lab 09/11/16 0340 09/12/16 0410  NA 138 139  K 3.3* 3.5  CL 107 106  CO2 20* 26  GLUCOSE 98 98  BUN 12 14  CREATININE 0.79 0.87  CALCIUM 8.8* 9.0    Lipid Panel:     Component Value Date/Time   CHOL 176 09/09/2016 0557   TRIG 62 09/09/2016 0557   HDL 51 09/09/2016 0557   CHOLHDL 3.5 09/09/2016 0557   VLDL 12 09/09/2016 0557   LDLCALC 113 (H) 09/09/2016 0557   HgbA1c:  Lab Results  Component Value Date   HGBA1C 5.2 09/09/2016   Urine Drug Screen: No results found for: LABOPIA, COCAINSCRNUR, LABBENZ, AMPHETMU, THCU, LABBARB    IMAGING I have personally reviewed the radiological images below and agree with the radiology interpretations.  Ct Angio Head and Neck W Or Wo Contrast 09/07/2016 1. Focal enhancing mass lesion or vascular abnormality to explain the left temporal hemorrhage.  2. Mild atherosclerotic changes at the carotid bifurcations bilaterally without significant stenosis.  3. Mild atherosclerotic changes at the aortic arch without significant stenosis.  4. Irregularity of the cervical left internal carotid artery raises the possibility of fibromuscular  dysplasia.  5. Tortuosity of the cervical internal carotid arteries bilaterally likely resulting from chronic hypertension.   Ct Head Wo Contrast 09/07/2016 1. Large volume hemorrhage within the left temporal lobe. This is an atypical location for hemorrhage. Underlying mass is not excluded. Significant mass effect is present as described. There is no midline shift or herniation. Recommend follow-up MRI of the brain without and with contrast in 2-3 weeks after initial blood products have partially cleared.  2. Mild atrophy and white matter disease otherwise.  3. Atherosclerosis.   [name] Wo Contrast 09/08/2016 IMPRESSION: No specific explanation for the large left temporal hematoma. No evidence of mass, vascular lesion, or amyloid angiography. Hematoma size and mass effect is stable from yesterday.  TTE  - Left ventricle: The cavity size was normal. Wall thickness was   normal. Systolic function was normal. The estimated ejection   fraction was in the range of 55% to 60%. Wall motion was normal;   there were no regional wall motion abnormalities. Features are   consistent with a pseudonormal left ventricular filling pattern,   with concomitant abnormal relaxation and increased filling   pressure (grade 2 diastolic dysfunction).  PHYSICAL EXAM  Temp:  [97.9 F (36.6 C)-99 F (37.2 C)] 98.4 F (36.9 C) (12/07 1800) Pulse Rate:  [76-84] 83 (  12/07 1800) Resp:  [16-20] 19 (12/07 1800) BP: (127-136)/(57-67) 136/57 (12/07 1800) SpO2:  [98 %-100 %] 98 % (12/07 1800)  General - Well nourished, well developed, in no apparent distress.  Ophthalmologic - Fundi not visualized due to noncooperation.  Cardiovascular - Regular rate and rhythm.  Neuro - awake alert, but global aphasia, improving, able to follow midline commands but not peripheral commands.  No neglect. Eyes in the middle position, attending to both sides. PERRL. Right facial droop mild, tongue in the middle. Moving all  extremities equally bilaterally. DTR 1+ and no babinski. Sensation, coordination and gait not tested due to noncooperation.    ASSESSMENT/PLAN Ms. Izella Ybanez is a 82 y.o. female with history of hypertension and hyperlipidemia presenting with speech difficulties and confusion. She did not receive IV t-PA due to hemorrhagic stroke.  Hemorrhagic stroke:  left temporal lobe ICH. Etiology unclear, HTN vs. Brain met.   Resultant  Global aphasia, right facial droop  MRI with and without contrast - left ICH hemorrhage, no CAA or mass  CTA H&N - Focal enhancing mass lesion or vascular abnormality to explain the left temporal hemorrhage.  Irregularity of the cervical left internal carotid artery raises the possibility of fibromuscular dysplasia. No left transverse sinus thrombosis   CT Head - Large volume hemorrhage within the left temporal lobe.   2D Echo - EF 55-60%  LDL - 113  HgbA1c  - 5.2  VTE prophylaxis -  SCDs Diet regular Room service appropriate? Yes; Fluid consistency: Thin  No antithrombotic prior to admission, now on No antithrombotic secondary to hemorrhage.  Ongoing aggressive stroke risk factor management  Therapy recommendations: SNF  Disposition:  Pending  Hypertension  BP stable - Cardene drip off  Current goal at <160 Labetalol PRN  Hyperlipidemia  Home meds: Zocor 20 mg daily resumed in hospital.  Continue statin at discharge  Other Stroke Risk Factors  Advanced age  Other Active Problems  Hypokalemia - 3.2, supplement   Hyponatremia - 131 -> 136  Hospital day # 5   [name], MD PhD Stroke Neurology 09/12/2016 9:29 PM

## 2016-09-13 LAB — GLUCOSE, CAPILLARY
Glucose-Capillary: 106 mg/dL — ABNORMAL HIGH (ref 65–99)
Glucose-Capillary: 132 mg/dL — ABNORMAL HIGH (ref 65–99)
Glucose-Capillary: 97 mg/dL (ref 65–99)

## 2016-09-13 NOTE — Discharge Summary (Signed)
Stroke Discharge Summary  Patient ID: Judith Henderson   MRN: [id number]      DOB: 09-11-1930  Date of Admission: 09/07/2016 Date of Discharge: 09/13/2016  Attending Physician:  Judith Hawking, MD, Stroke MD Consulting Physician(s):    rehabilitation medicine  Dr Judith Henderson Patient's PCP:  Judith Alamin, MD  DISCHARGE DIAGNOSIS: Large volume hemorrhage within the left temporal lobe. Active Problems:   ICH (intracerebral hemorrhage) (HCC)   Essential hypertension   Hemorrhagic stroke ([place])   Diastolic dysfunction   Diabetes mellitus type 2 in nonobese (HCC)   Multiple myeloma (HCC)   Benign essential HTN   Tachycardia   Hypothyroidism   Hypokalemia   Hypoalbuminemia due to protein-calorie malnutrition (HCC)   Acute blood loss anemia  BMI: Body mass index is 18.88 kg/m.  Past Medical History:  Diagnosis Date  . Diabetes mellitus type 2 in nonobese (HCC)   . Essential hypertension   . Multiple myeloma (HCC)    No past surgical history on file.    Medication List    STOP taking these medications   temazepam 15 MG capsule Commonly known as:  RESTORIL     TAKE these medications   levothyroxine 75 MCG tablet Commonly known as:  SYNTHROID, LEVOTHROID Take 75 mcg by mouth daily.   losartan 100 MG tablet Commonly known as:  COZAAR Take 100 mg by mouth daily.   mirtazapine 15 MG tablet Commonly known as:  REMERON Take 15 mg by mouth daily.   simvastatin 20 MG tablet Commonly known as:  ZOCOR Take 20 mg by mouth daily.       LABORATORY STUDIES CBC    Component Value Date/Time   WBC 5.3 09/12/2016 0410   RBC 3.57 (L) 09/12/2016 0410   HGB 10.8 (L) 09/12/2016 0410   HCT 32.1 (L) 09/12/2016 0410   PLT 214 09/12/2016 0410   MCV 89.9 09/12/2016 0410   MCH 30.3 09/12/2016 0410   MCHC 33.6 09/12/2016 0410   RDW 13.5 09/12/2016 0410   LYMPHSABS 0.8 09/07/2016 1802   MONOABS 0.5 09/07/2016 1802   EOSABS 0.0 09/07/2016 1802   BASOSABS 0.0 09/07/2016 1802   CMP     Component Value Date/Time   NA 139 09/12/2016 0410   K 3.5 09/12/2016 0410   CL 106 09/12/2016 0410   CO2 26 09/12/2016 0410   GLUCOSE 98 09/12/2016 0410   BUN 14 09/12/2016 0410   CREATININE 0.87 09/12/2016 0410   CALCIUM 9.0 09/12/2016 0410   PROT 5.9 (L) 09/09/2016 0557   ALBUMIN 3.3 (L) 09/09/2016 0557   AST 20 09/09/2016 0557   ALT 13 (L) 09/09/2016 0557   ALKPHOS 34 (L) 09/09/2016 0557   BILITOT 0.9 09/09/2016 0557   GFRNONAA 59 (L) 09/12/2016 0410   GFRAA >60 09/12/2016 0410   COAGS Lab Results  Component Value Date   INR 1.02 09/07/2016   Lipid Panel    Component Value Date/Time   CHOL 176 09/09/2016 0557   TRIG 62 09/09/2016 0557   HDL 51 09/09/2016 0557   CHOLHDL 3.5 09/09/2016 0557   VLDL 12 09/09/2016 0557   LDLCALC 113 (H) 09/09/2016 0557   HgbA1C  Lab Results  Component Value Date   HGBA1C 5.2 09/09/2016   Cardiac Panel (last 3 results) No results for input(s): CKTOTAL, CKMB, TROPONINI, RELINDX in the last 72 hours. Urinalysis No results found for: COLORURINE, APPEARANCEUR, LABSPEC, PHURINE, GLUCOSEU, HGBUR, BILIRUBINUR, KETONESUR, PROTEINUR, UROBILINOGEN, NITRITE, LEUKOCYTESUR Urine Drug Screen No results found for:  LABOPIA, COCAINSCRNUR, LABBENZ, AMPHETMU, THCU, LABBARB  Alcohol Level No results found for: [facility]   SIGNIFICANT DIAGNOSTIC STUDIES   Ct Angio Head and Neck W Or Wo Contrast 09/07/2016 1. Focal enhancing mass lesion or vascular abnormality to explain the left temporal hemorrhage.  2. Mild atherosclerotic changes at the carotid bifurcations bilaterally without significant stenosis.  3. Mild atherosclerotic changes at the aortic arch without significant stenosis.  4. Irregularity of the cervical left internal carotid artery raises the possibility of fibromuscular dysplasia.  5. Tortuosity of the cervical internal carotid arteries bilaterally likely resulting from chronic hypertension.   Ct Head Wo Contrast 09/07/2016 1. Large volume  hemorrhage within the left temporal lobe. This is an atypical location for hemorrhage. Underlying mass is not excluded. Significant mass effect is present as described. There is no midline shift or herniation. Recommend follow-up MRI of the brain without and with contrast in 2-3 weeks after initial blood products have partially cleared.  2. Mild atrophy and white matter disease otherwise.  3. Atherosclerosis.   Mr Judith Henderson Wo Contrast 09/08/2016 IMPRESSION: No specific explanation for the large left temporal hematoma. No evidence of mass, vascular lesion, or amyloid angiography. Hematoma size and mass effect is stable from yesterday.  TTE  - Left ventricle: The cavity size was normal. Wall thickness was normal. Systolic function was normal. The estimated ejection fraction was in the range of 55% to 60%. Wall motion was normal; there were no regional wall motion abnormalities. Features are consistent with a pseudonormal left ventricular filling pattern, with concomitant abnormal relaxation and increased filling pressure (grade 2 diastolic dysfunction).    HISTORY OF PRESENT ILLNESS Judith Henderson is an 82 y.o. female with a history hypertension and hyperlipidemia presenting to [facility] on 09/07/2016 with new onset of confusion and speech output abnormality. She had difficulty understanding what was being said to her and speech output was nonsensical, for the most part. Symptoms started on 09/06/2016 and were noted by family to be worse on the day of admission. No facial droop was been seen. Speech was not slurred. She had no focal weakness of extremities. There is no previous history of stroke nor TIA. CT scan of her head showed a large volume hemorrhage involving the left temporal lobe with surrounding edema and mass effect. CT angiogram showed no signs of aneurysm, nor AVM or mass lesion. NIH stroke score was 5.  HOSPITAL COURSE [name] is an 82 y.o. female with  a history of hypertension and hyperlipidemia presenting with speech difficulties and confusion. She did not receive IV t-PA due to a hemorrhagic stroke. She was admitted to the neuro intensive care unit.  Hemorrhagic stroke:  left temporal lobe ICH. Etiology unclear, HTN vs. Brain met.   Resultant  Global aphasia, right facial droop  MRI with and without contrast - left ICH hemorrhage, no CAA or mass  CTA H&N - Focal enhancing mass lesion or vascular abnormality to explain the left temporal hemorrhage.  Irregularity of the cervical left internal carotid artery raises the possibility of fibromuscular dysplasia. No left transverse sinus thrombosis   CT Head - Large volume hemorrhage within the left temporal lobe.   Repeat MRI in 4-6 weeks after hemorrhage absorbed to rule out underlying structural lesion  2D Echo - EF 55-60%  LDL - 113  HgbA1c  - 5.2  VTE prophylaxis -  SCDs  Diet regular Room service appropriate? Yes; Fluid consistency: Thin  No antithrombotic prior to admission, now on No  antithrombotic secondary to hemorrhage.  Ongoing aggressive stroke risk factor management  Therapy recommendations: SNF  Disposition:   discharged to a skilled nursing facility on 09/13/2016.  Hypertension  BP stable - Cardene drip off  Current goal at <160  Labetalol PRN  Hyperlipidemia  Home meds: Zocor 20 mg daily resumed in hospital.  Continue statin at discharge  Other Stroke Risk Factors  Advanced age  Other Active Problems  Hypokalemia - 3.2, supplement -> 3.5  Hyponatremia - 131 -> 136 -> 139  Mild elevations of ALT and alkaline phosphatase  Mild anemia  DISCHARGE EXAM Blood pressure (!) 122/54, pulse 70, temperature 98.2 F (36.8 C), temperature source Oral, resp. rate 20, height '5\' 4"'  (1.626 m), weight 49.9 kg (110 lb 0.2 oz), SpO2 98 %. General - Well nourished, well developed, in no apparent distress.  Ophthalmologic - Fundi not visualized due to  noncooperation.  Cardiovascular - Regular rate and rhythm.  Neuro - awake alert, but global aphasia, improving, able to follow midline commands but not peripheral commands.  No neglect. Eyes in the middle position, attending to both sides. PERRL. Right facial droop mild, tongue in the middle. Moving all extremities equally bilaterally. DTR 1+ and no babinski. Sensation, coordination and gait not tested due to noncooperation.   Discharge Diet   Diet regular Room service appropriate? Yes; Fluid consistency: Thin liquids  DISCHARGE PLAN  Disposition:  The patient is being discharged to a skilled nursing facility for further rehabilitation.  No antithrombotic for secondary stroke prevention secondary to hemorrhagic stroke.  Follow-up with Dr. Rosalin Henderson, Stroke Clinic in 6 - 8 weeks, office to schedule an appointment.   35 minutes were spent preparing discharge.  Judith Hawking, MD PhD Stroke Neurology 09/13/2016 10:23 PM

## 2016-09-13 NOTE — Progress Notes (Signed)
Speech Language Pathology Treatment: Cognitive-Linquistic  Patient Details Name: Judith Henderson MRN: [id number] DOB: 03-20-1930 Today's Date: 09/13/2016 Time: [id number] SLP Time Calculation (min) (ACUTE ONLY): 20 min  Assessment / Plan / Recommendation Clinical Impression  Aphasia treatment with brother present. Pt continues with Wernicke's type aphasia marked by fluent language, poor awareness of errors and decreased comprehension of basic information intermittently. Verbalizations consist of jargon, neologisms and phonemic paraphasias. SLP provided skilled intervention pairing verbalization with written cue successful in naming common objects with 10% of the time. She require phrase completion and phonemic cue effective 65% of the time. Decreased object identification from field of 2 with 30%. Pt demonstrated awareness of verbal error once during session. Continue ST in hospital and until discharged to next venue of care.    HPI HPI: Judith Henderson an 82 y.o.femalewith ahistory hypertension, hyperlipidemia presenting with new onset of confusion and speech output abnormality. CT scan of her head showed a large volume hemorrhage involving the left temporal lobe with surrounding edema and mass effect.       SLP Plan  Continue with current plan of care     Recommendations                   Oral Care Recommendations: Oral care BID Follow up Recommendations: Skilled Nursing facility Plan: Continue with current plan of care       GO                Houston Siren 09/13/2016, 3:18 PM   Orbie Pyo Colvin Caroli[employer] [phone]

## 2016-09-13 NOTE — Clinical Social Work Placement (Signed)
   CLINICAL SOCIAL WORK PLACEMENT  NOTE  Date:  09/13/2016  Patient Details  Name: Judith Henderson MRN: [id number] Date of Birth: 18-Oct-1929  Clinical Social Work is seeking post-discharge placement for this patient at the [place] level of care (*CSW will initial, date and re-position this form in  chart as items are completed):  Yes   Patient/family provided with [place] Work Department's list of facilities offering this level of care within the geographic area requested by the patient (or if unable, by the patient's family).  Yes   Patient/family informed of their freedom to choose among providers that offer the needed level of care, that participate in Medicare, Medicaid or managed care program needed by the patient, have an available bed and are willing to accept the patient.  Yes   Patient/family informed of [place]'s ownership interest in [facility] and [facility], as well as of the fact that they are under no obligation to receive care at these facilities.  PASRR submitted to EDS on 09/10/16     PASRR number received on 09/10/16     Existing PASRR number confirmed on       FL2 transmitted to all facilities in geographic area requested by pt/family on 09/11/16     FL2 transmitted to all facilities within larger geographic area on       Patient informed that his/her managed care company has contracts with or will negotiate with certain facilities, including the following:        Yes   Patient/family informed of bed offers received.  Patient chooses bed at [facility] and [place] recommends and patient chooses bed at      Patient to be transferred to [facility] on 09/13/16.  Patient to be transferred to facility by pt's brother     Patient family notified on 09/13/16 of transfer.  Name of family member notified:  pt's brother     PHYSICIAN       Additional Comment:     _______________________________________________ Darden Dates, LCSW 09/13/2016, 5:24 PM

## 2016-09-13 NOTE — Clinical Social Work Note (Signed)
Pt ready for discharge today and will go to [facility] with a Letter of Guarantee while [facility] is pending. Facility has initiated [place]. Pt and brother are aware and agreeable to discharge plan. Facility is ready to admit pt as they have receive discharge information. RN called report. Pt's brother provided transportation. CSW is signing off as no further needs identified.   Judith Henderson, MSW, LCSW  Clinical Social Worker  [phone]

## 2016-09-13 NOTE — Progress Notes (Signed)
Pt prepared for d/c to SNF.No IV present. Skin intact except as charted in most recent assessments. Vitals are stable. Report called to receiving facility. Pt to be transported by her brother.

## 2016-10-29 ENCOUNTER — Telehealth: Payer: Self-pay | Admitting: Neurology

## 2016-10-29 NOTE — Telephone Encounter (Signed)
Spoke with patients brother Jori Moll states patient is still not speaking clearly, hesitates, and having nausea.  Requesting patient to be seen sooner than March with Dr. Erlinda Hong can I schedule with Hoyle Sauer?

## 2016-10-29 NOTE — Telephone Encounter (Signed)
Brad with [facility] called in reference to PT orders requesting a change in frequency.  Per [facility] order was orginally from Dr. Felecia Shelling on 10/17/16.  Please call

## 2016-10-29 NOTE — Telephone Encounter (Signed)
Per discharge pt can see PCP or MD for stroke hospital follow up

## 2016-10-29 NOTE — Telephone Encounter (Signed)
Rn gave Judith Henderson the verbal order for PT for 3 weeks at 3 days a week per Dr. Erlinda Hong. Judith Henderson verbalized understanding.

## 2016-10-29 NOTE — Telephone Encounter (Signed)
Judith Henderson with Dr. Gara Kroner office called requesting patient to be seen sooner than March.  I asked as to why patient was needing to be seen sooner with no answer.  Is patient able to be seen with Cecille Rubin?

## 2016-12-11 ENCOUNTER — Ambulatory Visit (INDEPENDENT_AMBULATORY_CARE_PROVIDER_SITE_OTHER): Payer: Medicare HMO | Admitting: Neurology

## 2016-12-11 ENCOUNTER — Encounter (INDEPENDENT_AMBULATORY_CARE_PROVIDER_SITE_OTHER): Payer: Self-pay

## 2016-12-11 ENCOUNTER — Encounter: Payer: Self-pay | Admitting: Neurology

## 2016-12-11 VITALS — BP 134/72 | HR 71 | Ht 60.0 in | Wt 110.0 lb

## 2016-12-11 DIAGNOSIS — I611 Nontraumatic intracerebral hemorrhage in hemisphere, cortical: Secondary | ICD-10-CM

## 2016-12-11 DIAGNOSIS — E785 Hyperlipidemia, unspecified: Secondary | ICD-10-CM

## 2016-12-11 DIAGNOSIS — I10 Essential (primary) hypertension: Secondary | ICD-10-CM

## 2016-12-11 NOTE — Patient Instructions (Signed)
-   will repeat MRI with and without contrast to rule out any underlying structral lesion. - continue lipitor for stroke prevention - will refer to speech therapy - check BP at home and record - Follow up with your primary care physician for stroke risk factor modification. Recommend maintain blood pressure goal <130/80, diabetes with hemoglobin A1c goal below 7.0% and lipids with LDL cholesterol goal below 70 mg/dL.  - follow up in 4 months.

## 2016-12-12 DIAGNOSIS — E785 Hyperlipidemia, unspecified: Secondary | ICD-10-CM | POA: Insufficient documentation

## 2016-12-12 NOTE — Progress Notes (Signed)
STROKE NEUROLOGY FOLLOW UP NOTE  NAME: Judith Henderson DOB: 06/30/1930  REASON FOR VISIT: stroke follow up HISTORY FROM: brother and chart  Today we had the pleasure of seeing Judith Henderson in follow-up at our Neurology Clinic. Pt was accompanied by brother and caregiver.   History Summary Judith Henderson an 83 y.o.femalewith ahistory of hypertension and hyperlipidemia admitted on 09/07/16 for new onset of confusion and aphasia. CT head showed a moderate ICH at the left temporal lobe. CTA head and neck showed no signs of aneurysm, nor AVM or mass lesion. MRI brain with and without contrast showed no CAA or mass lesion. EF 55-60%. LDL 113 and A1C 5.2. Her zocor was continued on discharge to SNF. On discharge, she still has global aphasia but improving. Recommend to repeat MRI after ICH absorbed as outpt.  Interval History During the interval time, the patient has been doing well. Discharged from SNF on 10/10/16. She is at home now with caregiver. Her brother is her POA. Pt language much improved, still has difficulty with orientation questions and occasional expressive aphasia but largely recovered. BP stable 134/72. She feels good and no complains.      REVIEW OF SYSTEMS: Full 14 system review of systems performed and notable only for those listed below and in HPI above, all others are negative:  Constitutional:   Cardiovascular:  Ear/Nose/Throat:   Skin:  Eyes:   Respiratory:   Gastroitestinal:   Genitourinary:  Hematology/Lymphatic:   Endocrine:  Musculoskeletal:   Allergy/Immunology:   Neurological:  Memory loss, speech difficulty Psychiatric:  Sleep:   The following represents the patient's updated allergies and side effects list: Allergies  Allergen Reactions  . Amoxicillin Itching  . Oxycodone Itching  . Penicillin G Rash    The neurologically relevant items on the patient's problem list were reviewed on today's visit.  Neurologic Examination  A problem focused  neurological exam (12 or more points of the single system neurologic examination, vital signs counts as 1 point, cranial nerves count for 8 points) was performed.  Blood pressure 134/72, pulse 71, height 5' (1.524 m), weight 110 lb (49.9 kg).  General - Well nourished, well developed, in no apparent distress.  Ophthalmologic - Fundi not visualized due to noncooperation.  Cardiovascular - Regular rate and rhythm with no murmur.  Mental Status -  Level of arousal and orientation to self, age and person were intact, however, not orientated to time and place. Language exam showed fluent language output, able to follow simple commands, occasional paraphasic errors. Naming 3/6, difficulty with infrequently using words. Repetition intact.   Cranial Nerves II - XII - II - Visual field intact OU. III, IV, VI - Extraocular movements intact. V - Facial sensation intact bilaterally. VII - Facial movement intact bilaterally. VIII - Hearing & vestibular intact bilaterally. X - Palate elevates symmetrically. XI - Chin turning & shoulder shrug intact bilaterally. XII - Tongue protrusion intact.  Motor Strength - The patient's strength was normal in all extremities and pronator drift was absent.  Bulk was normal and fasciculations were absent.   Motor Tone - Muscle tone was assessed at the neck and appendages and was normal.  Reflexes - The patient's reflexes were 1+ in all extremities and she had no pathological reflexes.  Sensory - Light touch, temperature/pinprick were assessed and were normal.    Coordination - The patient had normal movements in the hands and feet with no ataxia or dysmetria.  Tremor was absent.  Gait and Station -  The patient's transfers, posture, gait, station, and turns were observed as normal.   Functional score  mRS = 2   0 - No symptoms.   1 - No significant disability. Able to carry out all usual activities, despite some symptoms.   2 - Slight disability. Able  to look after own affairs without assistance, but unable to carry out all previous activities.   3 - Moderate disability. Requires some help, but able to walk unassisted.   4 - Moderately severe disability. Unable to attend to own bodily needs without assistance, and unable to walk unassisted.   5 - Severe disability. Requires constant nursing care and attention, bedridden, incontinent.   6 - Dead.   NIH Stroke Scale   Level Of Consciousness 0=Alert; keenly responsive 1=Not alert, but arousable by minor stimulation 2=Not alert, requires repeated stimulation 3=Responds only with reflex movements 0  LOC Questions to Month and Age 30=Answers both questions correctly 1=Answers one question correctly 2=Answers neither question correctly 1  LOC Commands      -Open/Close eyes     -Open/close grip 0=Performs both tasks correctly 1=Performs one task correctly 2=Performs neighter task correctly 0  Best Gaze 0=Normal 1=Partial gaze palsy 2=Forced deviation, or total gaze paresis 0  Visual 0=No visual loss 1=Partial hemianopia 2=Complete hemianopia 3=Bilateral hemianopia (blind including cortical blindness) 0  Facial Palsy 0=Normal symmetrical movement 1=Minor paralysis (asymmetry) 2=Partial paralysis (lower face) 3=Complete paralysis (upper and lower face) 0  Motor  0=No drift, limb holds posture for full 10 seconds 1=Drift, limb holds posture, no drift to bed 2=Some antigravity effort, cannot maintain posture, drifts to bed 3=No effort against gravity, limb falls 4=No movement Right Arm 0     Leg 0    Left Arm 0     Leg 0  Limb Ataxia 0=Absent 1=Present in one limb 2=Present in two limbs 0  Sensory 0=Normal 1=Mild to moderate sensory loss 2=Severe to total sensory loss 0  Best Language 0=No aphasia, normal 1=Mild to moderate aphasia 2=Mute, global aphasia 3=Mute, global aphasia 1  Dysarthria 0=Normal 1=Mild to moderate 2=Severe, unintelligible or mute/anarthric 0    Extinction/Neglect 0=No abnormality 1=Extinction to bilateral simultaneous stimulation 2=Profound neglect 0  Total   2     Data reviewed: I personally reviewed the images and agree with the radiology interpretations.  Ct Angio Head and Neck W Or Wo Contrast 09/07/2016 1. Focal enhancing mass lesion or vascular abnormality to explain the left temporal hemorrhage.  2. Mild atherosclerotic changes at the carotid bifurcations bilaterally without significant stenosis.  3. Mild atherosclerotic changes at the aortic arch without significant stenosis.  4. Irregularity of the cervical left internal carotid artery raises the possibility of fibromuscular dysplasia.  5. Tortuosity of the cervical internal carotid arteries bilaterally likely resulting from chronic hypertension.   Ct Head Wo Contrast 09/07/2016 1. Large volume hemorrhage within the left temporal lobe.This is an atypical location for hemorrhage. Underlyingmass is not excluded.Significant mass effect is present as described.There is no midline shift or herniation. Recommend follow-up MRI of the brain without and with contrast in 2-3 weeks after initial blood products have partially cleared.  2. Mild atrophy and white matter disease otherwise.  3. Atherosclerosis.   [name] Wo Contrast 09/08/2016 IMPRESSION: No specific explanation for the large left temporal hematoma. No evidence of mass, vascular lesion, or amyloid angiography. Hematoma size and mass effect is stable from yesterday.  TTE  - Left ventricle: The cavity size was normal. Wall thickness was  normal. Systolic function was normal. The estimated ejection fraction was in the range of 55% to 60%. Wall motion was normal; there were no regional wall motion abnormalities. Features are consistent with a pseudonormal left ventricular filling pattern, with concomitant abnormal relaxation and increased filling pressure (grade 2 diastolic  dysfunction).  Component     Latest Ref Rng & Units 09/09/2016  Cholesterol     0 - 200 mg/dL 176  Triglycerides     <150 mg/dL 62  HDL Cholesterol     >40 mg/dL 51  Total CHOL/HDL Ratio     RATIO 3.5  VLDL     0 - 40 mg/dL 12  LDL (calc)     0 - 99 mg/dL 113 (H)  Hemoglobin A1C     4.8 - 5.6 % 5.2  Mean Plasma Glucose     mg/dL 103  Vitamin B12     180 - 914 pg/mL 2,034 (H)  TSH     0.350 - 4.500 uIU/mL 1.741    Assessment: As you may recall, she is a 83 y.o. Caucasian female with PMH of hypertension and hyperlipidemia admitted on 09/07/16 for moderate ICH at the left temporal lobe. CTA head and neck showed no signs of aneurysm, nor AVM or mass lesion. MRI brain with and without contrast showed no CAA or mass lesion. EF 55-60%. LDL 113 and A1C 5.2. On discharge, she still has global aphasia but improving. Discharged from SNF on 10/10/16. Pt language much improved, still has difficulty with orientation and some paraphasic errors and mild anomia. BP stable. Will repeat MRI with and without contrast to rule out underlying structure lesion after ICH resolves.   Plan:  - will repeat MRI with and without contrast to rule out any underlying structral lesion. (pt has PCP appointment for blood draw on 12/23/16) - continue lipitor for stroke prevention - Refer to speech therapy for further improvement - check BP at home and record - Follow up with your primary care physician for stroke risk factor modification. Recommend maintain blood pressure goal <130/80, diabetes with hemoglobin A1c goal below 7.0% and lipids with LDL cholesterol goal below 70 mg/dL.  - follow up in 4 months.   I spent more than 25 minutes of face to face time with the patient. Greater than 50% of time was spent in counseling and coordination of care. We discussed repeat MRI and continued speech therapy.    Orders Placed This Encounter  Procedures  . MR BRAIN W WO CONTRAST    Standing Status:   Future    Standing  Expiration Date:   02/10/2018    Scheduling Instructions:     Pt will have blood work with PCP on 12/23/16 so please schedule after that with Cre result. Thanks.    Order Specific Question:   If indicated for the ordered procedure, I authorize the administration of contrast media per Radiology protocol    Answer:   Yes    Order Specific Question:   Reason for Exam (SYMPTOM  OR DIAGNOSIS REQUIRED)    Answer:   ICH follow up    Order Specific Question:   What is the patient's sedation requirement?    Answer:   No Sedation    Order Specific Question:   Does the patient have a pacemaker or implanted devices?    Answer:   No    Order Specific Question:   Preferred imaging location?    Answer:   Internal    Order Specific  Question:   Radiology Contrast Protocol will attach to exam    Answer:   [email or web address]  . Ambulatory referral to Speech Therapy    Referral Priority:   Routine    Referral Type:   Speech Therapy    Referral Reason:   Specialty Services Required    Requested Specialty:   Speech Pathology    Number of Visits Requested:   1    Meds ordered this encounter  Medications  . atorvastatin (LIPITOR) 10 MG tablet    Sig: Take 10 mg by mouth.    Patient Instructions  - will repeat MRI with and without contrast to rule out any underlying structral lesion. - continue lipitor for stroke prevention - will refer to speech therapy - check BP at home and record - Follow up with your primary care physician for stroke risk factor modification. Recommend maintain blood pressure goal <130/80, diabetes with hemoglobin A1c goal below 7.0% and lipids with LDL cholesterol goal below 70 mg/dL.  - follow up in 4 months.    [name], MD PhD [facility] [address], [place], [place] [address] [phone]

## 2016-12-25 ENCOUNTER — Telehealth: Payer: Self-pay | Admitting: Neurology

## 2016-12-25 NOTE — Telephone Encounter (Signed)
I will check with her insurance and see if I can change the location and let the patient brother know.Judith Henderson

## 2016-12-25 NOTE — Telephone Encounter (Signed)
Thanks, Raquel Sarna.  [name], MD PhD Stroke Neurology 12/25/2016 9:17 AM

## 2016-12-25 NOTE — Telephone Encounter (Signed)
Pt's brother called said he would like next MRI to be scheduled at [facility]. Says he has 3 days left in the this month he would be able to take her. Original message is under 'Notes" tab which I cannot addend to."

## 2016-12-25 NOTE — Telephone Encounter (Signed)
Noted. If pt not able to tolerate, will have to cancel the study. Thank you.   [name], MD PhD Stroke Neurology 12/25/2016 8:44 AM

## 2016-12-25 NOTE — Telephone Encounter (Signed)
Courtney from [facility] emailed me this stating  ** WE DID NOT DO THIS STUDY, THE POOR PATINET WAS CONFUSED TO BEGIN WITH; HOWEVER, WE EXPLAINED TO HER HOW LONG THE TEST WAS GOING TO BE AND THE PATIENT WAS NOT IN THE MACHINE LONG WHEN SHE PUSHED THE BUTTON WANTING OUT AND TO QUIT. SHE STATED " I DONT WANNA DO THIS ANYMORE, THIS IS WAY TO LONG, I THINK THAT I HAVE DONE MORE THAN ENOUGH FOR THIS TEST." WE DID NOT GET ANYTING DIAGNOSTIC ON THIS PATIENT SO WE WILL NOT HAVE ANY PICTURES FOR THE DOCTORS TO READ.

## 2016-12-25 NOTE — Telephone Encounter (Signed)
I faxed the order to [facility] in [place]... I spoke to [facility] and they informed me once they receive the order they will call and schedule the MRI.

## 2016-12-26 ENCOUNTER — Telehealth: Payer: Self-pay | Admitting: Neurology

## 2016-12-26 ENCOUNTER — Other Ambulatory Visit: Payer: Self-pay

## 2016-12-26 MED ORDER — ALPRAZOLAM 0.5 MG PO TABS: 0.5000 mg | ORAL_TABLET | Freq: Two times a day (BID) | ORAL | 0 refills | Status: DC

## 2016-12-26 NOTE — Telephone Encounter (Signed)
RN call Zigmund Daniel at [facility] scheduling at [phone]. Rn stated Dr. Erlinda Hong was contacted and he will not order conscious sedation for patient to have MRI. PT was unable to have MRI at [facility] due to confusion, and not wanting to. Rn stated per Dr. Erlinda Hong if patient cannot have it without sedation than test can be cancel. Rn stated Dr Erlinda Hong suggested xanax for the test. Zigmund Daniel will be in office till 0330pm and will let nephew who is HCPOA know and call us back.

## 2016-12-26 NOTE — Telephone Encounter (Signed)
Zigmund Daniel called back to speak with RN. Call transferred to RN

## 2016-12-26 NOTE — Telephone Encounter (Signed)
Judith Henderson with [facility] scheduling called in reference to MRI order.  Patient states Nephew called [facility] scheduling requesting patient to be sedated completely.  Per patients MRI is scheduled for tomorrow with conscious sedation.  [facility] scheduling is needing to get clarification on sedation for patient before tomorrow.  Judith Henderson is faxing original orders to RN to have her review.  Please call

## 2016-12-26 NOTE — Telephone Encounter (Signed)
Rn call Dr .Erlinda Hong with a urgent request for conscious sedation. Rn stated the pt pts nephew wants pt to be sedated. Per Dr. Erlinda Hong he does not want pt sedation. If patient cannot have MRI without sedation, the MRI will have to be cancel. [facility] can also give order pt xanax if she can take.Message will be sent to [facility].

## 2016-12-26 NOTE — Telephone Encounter (Signed)
Rn receive incoming call from Bandana at [facility] scheduling. Judith Henderson stated another nephew name Harrell Gave answer the pts home phone. RN stated Judith Henderson is not on patients DPR at [facility] to discuss any medical/medication concerns to. Judith Henderson stated pts nephew stated if she cannot be sedated they will give her the xanax. Rx done for patient. Dr .Jaynee Eagles will sign rx and it will be fax to patients preferred pharmacy for pick up.

## 2016-12-26 NOTE — Telephone Encounter (Signed)
Thanks for the help. If pt still can not tolerate MRI even with Xanax, will cancel the test. No need to use conscious sedation for MRI test for her. Thanks.   [name], MD PhD Stroke Neurology 12/26/2016 3:21 PM

## 2016-12-26 NOTE — Telephone Encounter (Signed)
Dr.Ahern sign xanax for Dr. Erlinda Hong who is not working today for 2 pills and no refills for MRI testing tomorrow for patient. Rx was fax twice to [phone] at [facility] in [place] on [address].

## 2017-01-21 ENCOUNTER — Other Ambulatory Visit: Payer: Self-pay | Admitting: Neurology

## 2017-01-21 NOTE — Progress Notes (Signed)
Received MRI brain w and wo contrast result from [facility]) 12/28/26 reported as below:  "Left temporal encephalomalacia and susceptibility artifact with a small area of T1 hyperintense signal lee than a centimeter in diameter which may represent a small residue intraparenchymal hematoma superimposed upon an old hemorrhagic infarct. No intracranial mass, mass effect, or midline shift. Mild leukoaraiosis." signed by Dr. Mariea Clonts  [name], MD PhD Stroke Neurology 01/21/2017 3:22 PM

## 2017-02-03 ENCOUNTER — Telehealth: Payer: Self-pay | Admitting: Neurology

## 2017-02-03 NOTE — Telephone Encounter (Signed)
Received MRI report from [facility]  1. Small likely hematoma along the left temporal lobe which demonstrates evidence of old left hemorrhagic infarcts 2. Mild leukoaraiosis  I have talked with brother POA and delivered MRI report to him. He requested one copy to send to his home address. I agreed. I put the MRI copy in Katrina's desk. She will mail to the brother.   Brother's address:  [place] [address] [place], [place] [address]

## 2017-02-03 NOTE — Telephone Encounter (Signed)
Pt's brother Judith Henderson/POA called for MRI results. He said the MRI was done 3/23 @ [facility] and is wanting to know if the results have been rec'd bc he has not rec'd a call for the results. He said this is the 2nd time he has called.

## 2017-02-03 NOTE — Telephone Encounter (Signed)
Dr.Xu pts [name] her brother would like results of the MRI. I dont have a phone call that he has call twice about results. Please advise thanks.

## 2017-02-04 NOTE — Telephone Encounter (Signed)
Copy of MRI report sent to patients brother address her HCPOA that's listed below.

## 2017-04-09 ENCOUNTER — Telehealth: Payer: Self-pay | Admitting: Neurology

## 2017-04-09 NOTE — Telephone Encounter (Signed)
Judith Henderson's brother Judith Henderson) called to cancel the upcoming appt.  Judith Henderson was just admitted to [facility] ICU with an intracrnaial hemorrhage that is affecting her vision

## 2017-04-10 NOTE — Telephone Encounter (Signed)
Noted. Thank you for letting me know. Let the brother know that if pt stabilized after this event, he can call back and reschedule the appointment. Thanks  [name], MD PhD Stroke Neurology 04/10/2017 11:55 AM

## 2017-04-14 ENCOUNTER — Ambulatory Visit: Payer: Medicare HMO | Admitting: Neurology

## 2017-12-13 ENCOUNTER — Emergency Department (HOSPITAL_COMMUNITY): Payer: Medicare Other

## 2017-12-13 ENCOUNTER — Encounter (HOSPITAL_COMMUNITY): Payer: Self-pay | Admitting: Radiology

## 2017-12-13 ENCOUNTER — Observation Stay (HOSPITAL_COMMUNITY): Payer: Medicare Other

## 2017-12-13 ENCOUNTER — Inpatient Hospital Stay (HOSPITAL_COMMUNITY)
Admission: EM | Admit: 2017-12-13 | Discharge: 2017-12-17 | DRG: 552 | Disposition: A | Discharge status: Skilled Nursing Facility | Payer: Medicare Other | Attending: Surgery | Admitting: Surgery

## 2017-12-13 ENCOUNTER — Other Ambulatory Visit: Payer: Self-pay

## 2017-12-13 DIAGNOSIS — W109XXA Fall (on) (from) unspecified stairs and steps, initial encounter: Secondary | ICD-10-CM | POA: Diagnosis present

## 2017-12-13 DIAGNOSIS — Z880 Allergy status to penicillin: Secondary | ICD-10-CM

## 2017-12-13 DIAGNOSIS — Z23 Encounter for immunization: Secondary | ICD-10-CM

## 2017-12-13 DIAGNOSIS — I69398 Other sequelae of cerebral infarction: Secondary | ICD-10-CM

## 2017-12-13 DIAGNOSIS — S62306A Unspecified fracture of fifth metacarpal bone, right hand, initial encounter for closed fracture: Secondary | ICD-10-CM | POA: Diagnosis present

## 2017-12-13 DIAGNOSIS — E119 Type 2 diabetes mellitus without complications: Secondary | ICD-10-CM | POA: Diagnosis present

## 2017-12-13 DIAGNOSIS — Z6821 Body mass index (BMI) 21.0-21.9, adult: Secondary | ICD-10-CM

## 2017-12-13 DIAGNOSIS — R64 Cachexia: Secondary | ICD-10-CM | POA: Diagnosis present

## 2017-12-13 DIAGNOSIS — Z79899 Other long term (current) drug therapy: Secondary | ICD-10-CM

## 2017-12-13 DIAGNOSIS — G309 Alzheimer's disease, unspecified: Secondary | ICD-10-CM | POA: Diagnosis present

## 2017-12-13 DIAGNOSIS — S22001A Stable burst fracture of unspecified thoracic vertebra, initial encounter for closed fracture: Secondary | ICD-10-CM

## 2017-12-13 DIAGNOSIS — R339 Retention of urine, unspecified: Secondary | ICD-10-CM

## 2017-12-13 DIAGNOSIS — E039 Hypothyroidism, unspecified: Secondary | ICD-10-CM | POA: Diagnosis present

## 2017-12-13 DIAGNOSIS — K5900 Constipation, unspecified: Secondary | ICD-10-CM | POA: Diagnosis present

## 2017-12-13 DIAGNOSIS — Z79890 Hormone replacement therapy (postmenopausal): Secondary | ICD-10-CM

## 2017-12-13 DIAGNOSIS — Z885 Allergy status to narcotic agent status: Secondary | ICD-10-CM

## 2017-12-13 DIAGNOSIS — R52 Pain, unspecified: Secondary | ICD-10-CM

## 2017-12-13 DIAGNOSIS — S52615A Nondisplaced fracture of left ulna styloid process, initial encounter for closed fracture: Secondary | ICD-10-CM | POA: Diagnosis present

## 2017-12-13 DIAGNOSIS — S52552A Other extraarticular fracture of lower end of left radius, initial encounter for closed fracture: Secondary | ICD-10-CM | POA: Diagnosis present

## 2017-12-13 DIAGNOSIS — W19XXXA Unspecified fall, initial encounter: Secondary | ICD-10-CM | POA: Diagnosis present

## 2017-12-13 DIAGNOSIS — I10 Essential (primary) hypertension: Secondary | ICD-10-CM | POA: Diagnosis present

## 2017-12-13 DIAGNOSIS — S22051A Stable burst fracture of T5-T6 vertebra, initial encounter for closed fracture: Principal | ICD-10-CM | POA: Diagnosis present

## 2017-12-13 DIAGNOSIS — H5462 Unqualified visual loss, left eye, normal vision right eye: Secondary | ICD-10-CM | POA: Diagnosis present

## 2017-12-13 DIAGNOSIS — E785 Hyperlipidemia, unspecified: Secondary | ICD-10-CM | POA: Diagnosis present

## 2017-12-13 DIAGNOSIS — Z881 Allergy status to other antibiotic agents status: Secondary | ICD-10-CM

## 2017-12-13 DIAGNOSIS — C9000 Multiple myeloma not having achieved remission: Secondary | ICD-10-CM | POA: Diagnosis present

## 2017-12-13 DIAGNOSIS — S22000A Wedge compression fracture of unspecified thoracic vertebra, initial encounter for closed fracture: Secondary | ICD-10-CM

## 2017-12-13 DIAGNOSIS — S22009A Unspecified fracture of unspecified thoracic vertebra, initial encounter for closed fracture: Secondary | ICD-10-CM | POA: Diagnosis present

## 2017-12-13 DIAGNOSIS — F0280 Dementia in other diseases classified elsewhere without behavioral disturbance: Secondary | ICD-10-CM | POA: Diagnosis present

## 2017-12-13 LAB — BASIC METABOLIC PANEL
Anion gap: 11 (ref 5–15)
BUN: 29 mg/dL — ABNORMAL HIGH (ref 6–20)
CO2: 24 mmol/L (ref 22–32)
Calcium: 9.2 mg/dL (ref 8.9–10.3)
Chloride: 105 mmol/L (ref 101–111)
Creatinine, Ser: 0.97 mg/dL (ref 0.44–1.00)
GFR calc Af Amer: 60 mL/min — ABNORMAL LOW (ref >60)
GFR calc non Af Amer: 51 mL/min — ABNORMAL LOW (ref >60)
Glucose, Bld: 136 mg/dL — ABNORMAL HIGH (ref 65–99)
Potassium: 3.9 mmol/L (ref 3.5–5.1)
Sodium: 140 mmol/L (ref 135–145)

## 2017-12-13 LAB — I-STAT CHEM 8, ED
BUN: 30 mg/dL — ABNORMAL HIGH (ref 6–20)
Calcium, Ion: 1.19 mmol/L (ref 1.15–1.40)
Chloride: 104 mmol/L (ref 101–111)
Creatinine, Ser: 1 mg/dL (ref 0.44–1.00)
Glucose, Bld: 140 mg/dL — ABNORMAL HIGH (ref 65–99)
HCT: 35 % — ABNORMAL LOW (ref 36.0–46.0)
Hemoglobin: 11.9 g/dL — ABNORMAL LOW (ref 12.0–15.0)
Potassium: 4 mmol/L (ref 3.5–5.1)
Sodium: 142 mmol/L (ref 135–145)
TCO2: 25 mmol/L (ref 22–32)

## 2017-12-13 LAB — CBC WITH DIFFERENTIAL/PLATELET
Basophils Absolute: 0 10*3/uL (ref 0.0–0.1)
Basophils Relative: 0 %
Eosinophils Absolute: 0.1 10*3/uL (ref 0.0–0.7)
Eosinophils Relative: 0 %
HCT: 36.4 % (ref 36.0–46.0)
Hemoglobin: 11.9 g/dL — ABNORMAL LOW (ref 12.0–15.0)
Lymphocytes Relative: 12 %
Lymphs Abs: 1.6 10*3/uL (ref 0.7–4.0)
MCH: 30.7 pg (ref 26.0–34.0)
MCHC: 32.7 g/dL (ref 30.0–36.0)
MCV: 94.1 fL (ref 78.0–100.0)
Monocytes Absolute: 0.9 10*3/uL (ref 0.1–1.0)
Monocytes Relative: 6 %
Neutro Abs: 11.1 10*3/uL — ABNORMAL HIGH (ref 1.7–7.7)
Neutrophils Relative %: 82 %
Platelets: 173 10*3/uL (ref 150–400)
RBC: 3.87 MIL/uL (ref 3.87–5.11)
RDW: 13.7 % (ref 11.5–15.5)
WBC: 13.7 10*3/uL — ABNORMAL HIGH (ref 4.0–10.5)

## 2017-12-13 LAB — LIPASE, BLOOD: Lipase: 99 U/L — ABNORMAL HIGH (ref 11–51)

## 2017-12-13 MED ORDER — DOCUSATE SODIUM 100 MG PO CAPS
100.0000 mg | ORAL_CAPSULE | Freq: Two times a day (BID) | ORAL | Status: DC
  Administered 2017-12-13 – 2017-12-17 (×7): 100 mg via ORAL
  Filled 2017-12-13 (×8): qty 1

## 2017-12-13 MED ORDER — ONDANSETRON HCL 4 MG/2ML IJ SOLN
4.0000 mg | Freq: Once | INTRAMUSCULAR | Status: AC
  Administered 2017-12-13: 4 mg via INTRAVENOUS
  Filled 2017-12-13: qty 2

## 2017-12-13 MED ORDER — FENTANYL CITRATE (PF) 100 MCG/2ML IJ SOLN
25.0000 ug | INTRAMUSCULAR | Status: DC | PRN
  Administered 2017-12-13 – 2017-12-16 (×4): 25 ug via INTRAVENOUS
  Filled 2017-12-13 (×4): qty 2

## 2017-12-13 MED ORDER — MORPHINE SULFATE (PF) 4 MG/ML IV SOLN
4.0000 mg | Freq: Once | INTRAVENOUS | Status: AC
  Administered 2017-12-13: 4 mg via INTRAVENOUS
  Filled 2017-12-13: qty 1

## 2017-12-13 MED ORDER — FENTANYL CITRATE (PF) 100 MCG/2ML IJ SOLN
50.0000 ug | Freq: Once | INTRAMUSCULAR | Status: AC
  Administered 2017-12-13: 50 ug via INTRAVENOUS
  Filled 2017-12-13: qty 2

## 2017-12-13 MED ORDER — ONDANSETRON 4 MG PO TBDP
4.0000 mg | ORAL_TABLET | Freq: Four times a day (QID) | ORAL | Status: DC | PRN
  Filled 2017-12-13: qty 1

## 2017-12-13 MED ORDER — LEVOTHYROXINE SODIUM 75 MCG PO TABS: 75.0000 ug | ORAL_TABLET | Freq: Every day | ORAL | Status: DC

## 2017-12-13 MED ORDER — TRAMADOL HCL 50 MG PO TABS
50.0000 mg | ORAL_TABLET | Freq: Four times a day (QID) | ORAL | Status: DC | PRN
  Administered 2017-12-13 – 2017-12-16 (×8): 50 mg via ORAL
  Filled 2017-12-13 (×8): qty 1

## 2017-12-13 MED ORDER — ACETAMINOPHEN 325 MG PO TABS: 650.0000 mg | ORAL_TABLET | ORAL | Status: DC | PRN

## 2017-12-13 MED ORDER — TETANUS-DIPHTH-ACELL PERTUSSIS 5-2.5-18.5 LF-MCG/0.5 IM SUSP
0.5000 mL | Freq: Once | INTRAMUSCULAR | Status: AC
  Administered 2017-12-13: 0.5 mL via INTRAMUSCULAR
  Filled 2017-12-13: qty 0.5

## 2017-12-13 MED ORDER — METOPROLOL TARTRATE 5 MG/5ML IV SOLN
5.0000 mg | Freq: Four times a day (QID) | INTRAVENOUS | Status: DC | PRN
  Administered 2017-12-16: 5 mg via INTRAVENOUS
  Filled 2017-12-13: qty 5

## 2017-12-13 MED ORDER — LOSARTAN POTASSIUM 50 MG PO TABS
100.0000 mg | ORAL_TABLET | Freq: Every day | ORAL | Status: DC
  Filled 2017-12-13: qty 2

## 2017-12-13 MED ORDER — MORPHINE SULFATE (PF) 4 MG/ML IV SOLN
2.0000 mg | Freq: Once | INTRAVENOUS | Status: AC
  Administered 2017-12-13: 2 mg via INTRAVENOUS
  Filled 2017-12-13: qty 1

## 2017-12-13 MED ORDER — LEVOTHYROXINE SODIUM 75 MCG PO TABS
75.0000 ug | ORAL_TABLET | Freq: Every day | ORAL | Status: DC
  Administered 2017-12-14 – 2017-12-17 (×4): 75 ug via ORAL
  Filled 2017-12-13 (×4): qty 1

## 2017-12-13 MED ORDER — BISACODYL 10 MG RE SUPP: 10.0000 mg | Freq: Every day | RECTAL | Status: DC | PRN

## 2017-12-13 MED ORDER — SODIUM CHLORIDE 0.9 % IV SOLN
INTRAVENOUS | Status: DC
  Administered 2017-12-13 – 2017-12-16 (×3): via INTRAVENOUS

## 2017-12-13 MED ORDER — DONEPEZIL HCL 5 MG PO TABS
5.0000 mg | ORAL_TABLET | Freq: Every day | ORAL | Status: DC
  Administered 2017-12-14 – 2017-12-17 (×4): 5 mg via ORAL
  Filled 2017-12-13 (×5): qty 1

## 2017-12-13 MED ORDER — HYDRALAZINE HCL 20 MG/ML IJ SOLN
10.0000 mg | INTRAMUSCULAR | Status: DC | PRN
  Administered 2017-12-15: 10 mg via INTRAVENOUS
  Filled 2017-12-13: qty 1

## 2017-12-13 MED ORDER — ONDANSETRON HCL 4 MG/2ML IJ SOLN
4.0000 mg | Freq: Four times a day (QID) | INTRAMUSCULAR | Status: DC | PRN
  Administered 2017-12-13: 4 mg via INTRAVENOUS
  Filled 2017-12-13: qty 2

## 2017-12-13 MED ORDER — IOPAMIDOL (ISOVUE-300) INJECTION 61%
INTRAVENOUS | Status: AC
  Administered 2017-12-13: 75 mL
  Filled 2017-12-13: qty 75

## 2017-12-13 MED ORDER — MIRTAZAPINE 7.5 MG PO TABS
7.5000 mg | ORAL_TABLET | Freq: Two times a day (BID) | ORAL | Status: DC
  Administered 2017-12-13 – 2017-12-17 (×7): 7.5 mg via ORAL
  Filled 2017-12-13 (×10): qty 1

## 2017-12-13 NOTE — Consult Note (Signed)
84yo woman with h/o stroke, Alzheimers, multiple myeloma, HTN, hemorrhagic stroke (residual L eye blindness) and T2DM who fell down about 16 stairs into her basement early this AM, shortly before ER arrival which was around 0600. Does not recall event. Brought to ER where trauma scans and plain films show bilateral wrist fx and T6 burst fx with 19m retropulsion, no other injuries. C/o back pain and wrist pain- seems improved from initial Er eval. Caregiver is nephew who is at bedside. Pt lives with him and her husband, who is at bedside and has an aspen collar on....  (per chart)  The patient's x-rays were reviewed. On the right hand the patient has a minimally displaced base of the small finger metacarpal fracture. On the left wrist the patient has a mildly comminuted distal radius fracture without significant deformity  Based on review of the chart and review of the radiographs these will both be treated without surgery. Patient was recommended be placed into splints that she can take on and off for bathing and hygiene. Both these will be treated in a closed fashion the form happy to see her back in my office at the conclusion or hospital stay She is nonweightbearing in the left wrist. Ice elevation fracture management Please contact me if any further questions arise. Please contact me directly at [phone] would need to see her in the office in approximately 2 weeks

## 2017-12-13 NOTE — ED Notes (Signed)
Patient transported to X-ray 

## 2017-12-13 NOTE — ED Notes (Signed)
ED Provider at bedside. 

## 2017-12-13 NOTE — ED Notes (Signed)
Nurse starting IV and will get blood.

## 2017-12-13 NOTE — ED Provider Notes (Signed)
[facility] Provider Note   CSN: [id number] Arrival date & time: 12/13/17  3086     History   Chief Complaint Chief Complaint  Patient presents with  . Fall   Level 5 caveat due to dementia HPI Judith Henderson is a 84 y.o. female with history of Alzheimer's dementia, multiple myeloma, hypertension, hemorrhagic stroke, type 2 diabetes presents today for evaluation of fall.  Patient's nephew is her caretaker and lives with her and her husband.  He states that just prior to arrival he believes that she got out of bed while dreaming that she was going to work, open her bedroom door, and then fell down a flight of stairs to the basement.  Patient's nephew states that he found her curled up in a fetal position on the landing at the bottom of the stairs.  She is complaining of pain "all over "but is able to localize pain to the chest wall anteriorly, epigastric region, and bilateral wrists.  She has ecchymosis and swelling to the bilateral wrist.  She is unable to describe the pain or tell me if the pain radiates.  She is crying out in pain frequently.  She denies headache or neck pain.  She is not on blood thinners. No nausea or vomiting.  Patient's nephew states that she is alert and oriented to person only at baseline  And that she is currently at her baseline.  Residual deficits from prior strokes include loss of vision of the left eye.  The history is provided by the patient and a relative.    Past Medical History:  Diagnosis Date  . Essential hypertension   . Multiple myeloma ([place])   . Stroke [facility])     Patient Active Problem List   Diagnosis Date Noted  . Thoracic spine fracture ([place]) 12/13/2017  . Hyperlipidemia 12/12/2016  . Hemorrhagic stroke ([place])   . Diastolic dysfunction   . Diabetes mellitus type 2 in nonobese ([facility])   . Multiple myeloma ([place])   . Benign essential HTN   . Tachycardia   . Hypothyroidism   . Hypokalemia   .  Hypoalbuminemia due to protein-calorie malnutrition ([place])   . Acute blood loss anemia   . Essential hypertension   . ICH (intracerebral hemorrhage) ([place]) 09/07/2016    History reviewed. No pertinent surgical history.  OB History    No data available       Home Medications    Prior to Admission medications   Medication Sig Start Date End Date Taking? Authorizing Provider  atorvastatin (LIPITOR) 10 MG tablet Take 10 mg by mouth. 11/08/16  Yes [provider]  donepezil (ARICEPT) 5 MG tablet Take 5 mg by mouth daily. 12/02/17  Yes [provider]  levothyroxine (SYNTHROID, LEVOTHROID) 75 MCG tablet Take 75 mcg by mouth daily. 08/21/16  Yes [provider]  losartan (COZAAR) 100 MG tablet Take 100 mg by mouth daily. 08/23/16  Yes [provider]  mirtazapine (REMERON) 15 MG tablet Take 7.5 mg by mouth 2 (two) times daily.  08/21/16  Yes [provider]    Family History History reviewed. No pertinent family history.  Social History Social History   Tobacco Use  . Smoking status: Never Smoker  . Smokeless tobacco: Never Used  Substance Use Topics  . Alcohol use: No  . Drug use: No     Allergies   Amoxicillin; Oxycodone; and Penicillin g   Review of Systems Review of Systems  Unable  to perform ROS: Dementia     Physical Exam Updated Vital Signs BP (!) 150/71 (BP Location: Right Arm)   Pulse 68   Temp 97.7 F (36.5 C) (Oral)   Resp 15   Wt 49.4 kg (109 lb)   SpO2 97%   BMI 21.29 kg/m   Physical Exam  Constitutional: She appears well-developed and well-nourished. No distress.  Resting in bed, crying out in pain frequently.  HENT:  Head: Normocephalic and atraumatic.  Right Ear: External ear normal.  Left Ear: External ear normal.  No Battle's signs, no raccoon's eyes, no rhinorrhea. No hemotympanum. No tenderness to palpation of the face or skull. No deformity, crepitus, or swelling noted.   Eyes: Conjunctivae  are normal. Pupils are equal, round, and reactive to light. Right eye exhibits no discharge. Left eye exhibits no discharge.  Neck: Normal range of motion. Neck supple. No JVD present. No tracheal deviation present.  Cardiovascular: Normal rate and intact distal pulses.  2+ radial and DP/PT pulses bl, negative Homan's bl   Pulmonary/Chest: Effort normal and breath sounds normal. She exhibits tenderness.  Diffuse tenderness to palpation of the anterior chest wall, worst on the left side and along the costal margin.  No ecchymosis, crepitus, or paradoxical wall motion.  Abdominal: Soft. Bowel sounds are normal. She exhibits no distension. There is tenderness.  Patient will grimace and cry out in pain on palpation of the epigastric region.  Musculoskeletal: She exhibits no edema or tenderness.  No midline spine TTP, no paraspinal muscle tenderness, no deformity, crepitus, or step-off noted.  Slightly decreased grip strength bilaterally secondary to pain.  She has ecchymosis and swelling of the bilateral wrists and decreased range of motion.  Diffuse tenderness to palpation of the wrists.  Otherwise 5/5 strength of BUE and BLE major muscle groups.  Neurological: She is alert. No cranial nerve deficit or sensory deficit.  Fluent speech, no facial droop.  Sensation intact to soft touch of extremities.  Oriented to person only.  Skin: Skin is warm and dry. No erythema.  2 small superficial skin tears noted to the right elbow.  Bleeding controlled.  Psychiatric: She has a normal mood and affect. Her behavior is normal.  Nursing note and vitals reviewed.    ED Treatments / Results  Labs (all labs ordered are listed, but only abnormal results are displayed) Labs Reviewed  CBC WITH DIFFERENTIAL/PLATELET - Abnormal; Notable for the following components:      Result Value   WBC 13.7 (*)    Hemoglobin 11.9 (*)    Neutro Abs 11.1 (*)    All other components within normal limits  BASIC METABOLIC PANEL  - Abnormal; Notable for the following components:   Glucose, Bld 136 (*)    BUN 29 (*)    GFR calc non Af Amer 51 (*)    GFR calc Af Amer 60 (*)    All other components within normal limits  LIPASE, BLOOD - Abnormal; Notable for the following components:   Lipase 99 (*)    All other components within normal limits  I-STAT CHEM 8, ED - Abnormal; Notable for the following components:   BUN 30 (*)    Glucose, Bld 140 (*)    Hemoglobin 11.9 (*)    HCT 35.0 (*)    All other components within normal limits  URINALYSIS, ROUTINE W REFLEX MICROSCOPIC    EKG  EKG Interpretation  Date/Time:  Saturday December 13 2017 05:45:17 EST Ventricular Rate:  65 PR  Interval:    QRS Duration: 91 QT Interval:  416 QTC Calculation: 433 R Axis:   78 Text Interpretation:  Sinus rhythm Normal ECG Confirmed by Orpah Greek [phone]) on 12/13/2017 6:27:18 AM       Radiology Dg Chest 2 View  Result Date: 12/13/2017 CLINICAL DATA:  Status post fall down flight of stairs, with acute onset of generalized chest pain and shortness of breath. Initial encounter. EXAM: CHEST - 2 VIEW COMPARISON:  Thoracic spine radiographs performed 03/17/2013 FINDINGS: The lungs are well-aerated. Minimal left basilar opacity may reflect atelectasis. There is no evidence of pleural effusion or pneumothorax. The heart is borderline normal in size. Compression deformities are noted at the midthoracic and upper lumbar spine, new from 2014. IMPRESSION: 1. No displaced rib fracture seen. Minimal left basilar airspace opacity may reflect atelectasis. 2. Compression deformities of the midthoracic and upper lumbar spine are new from 2014 but may still be chronic in nature. Would correlate for any associated symptoms. Electronically Signed   By: Garald Balding M.D.   On: 12/13/2017 06:54   Dg Wrist Complete Left  Result Date: 12/13/2017 CLINICAL DATA:  Status post fall down flight of stairs, with left wrist pain and swelling. Initial  encounter. EXAM: LEFT WRIST - COMPLETE 3+ VIEW COMPARISON:  None. FINDINGS: There is a mildly impacted and displaced fracture involving the distal radius, without significant angulation. An ulnar styloid fracture is also seen. There is calcification of the triangular fibrocartilage. Degenerative change is noted at the first carpometacarpal joint. Mild degenerative change is noted at the visualized interphalangeal joints. Soft tissue swelling is noted about the wrist. IMPRESSION: 1. Mildly impacted and displaced fracture involving the distal radius, without significant angulation. 2. Ulnar styloid fracture noted. 3. Calcification of the triangular fibrocartilage. Electronically Signed   By: Garald Balding M.D.   On: 12/13/2017 06:56   Dg Wrist Complete Right  Result Date: 12/13/2017 CLINICAL DATA:  Status post fall down flight of stairs, with right wrist pain and swelling. Initial encounter. EXAM: RIGHT WRIST - COMPLETE 3+ VIEW COMPARISON:  None. FINDINGS: There is a mildly displaced fracture at the base of the fifth metatarsal, without definite evidence of intra-articular extension. Degenerative change is noted at the radial aspect of the carpal rows, with sclerosis. There is calcification of the triangular fibrocartilage. No significant soft tissue abnormalities are seen. IMPRESSION: 1. Mildly displaced fracture at the base of the fifth metatarsal, without definite evidence of intra-articular extension. 2. Calcification of the triangular fibrocartilage. Electronically Signed   By: Garald Balding M.D.   On: 12/13/2017 06:57   Ct Head Wo Contrast  Result Date: 12/13/2017 CLINICAL DATA:  Fall down stairs. EXAM: CT HEAD WITHOUT CONTRAST CT CERVICAL SPINE WITHOUT CONTRAST TECHNIQUE: Multidetector CT imaging of the head and cervical spine was performed following the standard protocol without intravenous contrast. Multiplanar CT image reconstructions of the cervical spine were also generated. COMPARISON:   04/12/2017.  CTA neck 09/07/2016 FINDINGS: CT HEAD FINDINGS Brain: There is atrophy and chronic small vessel disease changes. Associated ventriculomegaly. No acute infarction or hemorrhage. Area of encephalomalacia within the posterior right temporal and parietal lobe in area of prior hemorrhage. Vascular: No hyperdense vessel or unexpected calcification. Skull: No acute calvarial abnormality. Sinuses/Orbits: Visualized paranasal sinuses and mastoids clear. Orbital soft tissues unremarkable. Other: None CT CERVICAL SPINE FINDINGS Alignment: 2-3 mm anterolisthesis of C3 on C4, C4 on C5, and C5 on C6. This is stable since 09/07/2016 and likely related to facet arthropathy. Skull  base and vertebrae: No fracture Soft tissues and spinal canal: Prevertebral soft tissues are normal. No epidural or paraspinal hematoma. Disc levels: Diffuse degenerative facet disease bilaterally. Degenerative disc disease at C6-7 with disc space narrowing and anterior spurring Upper chest: Biapical scarring. Other: Carotid artery calcifications. IMPRESSION: No acute intracranial abnormality. Atrophy, chronic small vessel disease.  Associated ventriculomegaly. Degenerative disc and facet disease. Grade 1 anterolisthesis at C3-4, C4-5 and C5-6 related to facet disease. No acute bony abnormality. Electronically Signed   By: Rolm Baptise M.D.   On: 12/13/2017 08:55   Ct Chest W Contrast  Result Date: 12/13/2017 CLINICAL DATA:  [place] down stairs.  Wrist pain.  Dementia. EXAM: CT CHEST, ABDOMEN, AND PELVIS WITH CONTRAST TECHNIQUE: Multidetector CT imaging of the chest, abdomen and pelvis was performed following the standard protocol during bolus administration of intravenous contrast. CONTRAST:  11m ISOVUE-300 IOPAMIDOL (ISOVUE-300) INJECTION 61% COMPARISON:  10/01/2017 FINDINGS: CT CHEST FINDINGS Cardiovascular: Scattered aortic plaque. No acute findings. Heart size normal. No pericardial effusion. Mediastinum/Nodes: No mediastinal hematoma.  No hilar or mediastinal adenopathy. Lungs/Pleura: No pleural effusion. Partially calcified right pleural plaque. No pneumothorax. Dependent atelectasis posteriorly in both lower lobes. Focal pleural-based consolidation/atelectasis inferomedially in the right middle lobe. Pleural based nodules in the right middle lobe less than 0.5 cm. Symmetric biapical pleuroparenchymal scarring. Lungs otherwise clear. Musculoskeletal: Acute appearing burst fracture of T6 vertebral body with greater than 60% loss of height anteriorly. Approximately 4 mm retropulsion into the spinal canal. Posterior elements appear intact. No other acute bone abnormality evident. CT ABDOMEN PELVIS FINDINGS Hepatobiliary: No focal liver abnormality is seen. No gallstones, gallbladder wall thickening, or biliary dilatation. Pancreas: Unremarkable. No pancreatic ductal dilatation or surrounding inflammatory changes. Spleen: Normal in size without focal abnormality. Adrenals/Urinary Tract: Adrenal glands are unremarkable. Kidneys are normal, without renal calculi, focal lesion, or hydronephrosis. Bladder is unremarkable. Stomach/Bowel: Gaseous distention of the stomach. Small bowel nondilated. Normal appendix. Moderate fecal material in the proximal colon, decompressed distally. Vascular/Lymphatic: Moderate atheromatous calcifications of the aorta without aneurysm or stenosis. Portal vein patent. No abdominal or pelvic adenopathy. Reproductive: Status post hysterectomy. No adnexal masses. Other: No ascites.  No free air. Musculoskeletal: Chronic L2 compression deformity. Chronic mild L5 compression fracture deformity. Mild spondylitic changes in the lower lumbar spine. No acute fracture or worrisome bone lesion. IMPRESSION: 1. Acute appearing burst fracture of T6 vertebral body with 4 mm retropulsion into spinal canal. 2. No acute abdominal findings. 3. Chronic compression fracture deformities of L2 and L5 vertebral bodies. Electronically Signed   By: DLucrezia EuropeM.D.   On: 12/13/2017 09:22   Ct Cervical Spine Wo Contrast  Result Date: 12/13/2017 CLINICAL DATA:  Fall down stairs. EXAM: CT HEAD WITHOUT CONTRAST CT CERVICAL SPINE WITHOUT CONTRAST TECHNIQUE: Multidetector CT imaging of the head and cervical spine was performed following the standard protocol without intravenous contrast. Multiplanar CT image reconstructions of the cervical spine were also generated. COMPARISON:  04/12/2017.  CTA neck 09/07/2016 FINDINGS: CT HEAD FINDINGS Brain: There is atrophy and chronic small vessel disease changes. Associated ventriculomegaly. No acute infarction or hemorrhage. Area of encephalomalacia within the posterior right temporal and parietal lobe in area of prior hemorrhage. Vascular: No hyperdense vessel or unexpected calcification. Skull: No acute calvarial abnormality. Sinuses/Orbits: Visualized paranasal sinuses and mastoids clear. Orbital soft tissues unremarkable. Other: None CT CERVICAL SPINE FINDINGS Alignment: 2-3 mm anterolisthesis of C3 on C4, C4 on C5, and C5 on C6. This is stable since 09/07/2016 and likely related  to facet arthropathy. Skull base and vertebrae: No fracture Soft tissues and spinal canal: Prevertebral soft tissues are normal. No epidural or paraspinal hematoma. Disc levels: Diffuse degenerative facet disease bilaterally. Degenerative disc disease at C6-7 with disc space narrowing and anterior spurring Upper chest: Biapical scarring. Other: Carotid artery calcifications. IMPRESSION: No acute intracranial abnormality. Atrophy, chronic small vessel disease.  Associated ventriculomegaly. Degenerative disc and facet disease. Grade 1 anterolisthesis at C3-4, C4-5 and C5-6 related to facet disease. No acute bony abnormality. Electronically Signed   By: Rolm Baptise M.D.   On: 12/13/2017 08:55   Ct Abdomen Pelvis W Contrast  Result Date: 12/13/2017 CLINICAL DATA:  [place] down stairs.  Wrist pain.  Dementia. EXAM: CT CHEST, ABDOMEN, AND PELVIS WITH  CONTRAST TECHNIQUE: Multidetector CT imaging of the chest, abdomen and pelvis was performed following the standard protocol during bolus administration of intravenous contrast. CONTRAST:  41m ISOVUE-300 IOPAMIDOL (ISOVUE-300) INJECTION 61% COMPARISON:  10/01/2017 FINDINGS: CT CHEST FINDINGS Cardiovascular: Scattered aortic plaque. No acute findings. Heart size normal. No pericardial effusion. Mediastinum/Nodes: No mediastinal hematoma. No hilar or mediastinal adenopathy. Lungs/Pleura: No pleural effusion. Partially calcified right pleural plaque. No pneumothorax. Dependent atelectasis posteriorly in both lower lobes. Focal pleural-based consolidation/atelectasis inferomedially in the right middle lobe. Pleural based nodules in the right middle lobe less than 0.5 cm. Symmetric biapical pleuroparenchymal scarring. Lungs otherwise clear. Musculoskeletal: Acute appearing burst fracture of T6 vertebral body with greater than 60% loss of height anteriorly. Approximately 4 mm retropulsion into the spinal canal. Posterior elements appear intact. No other acute bone abnormality evident. CT ABDOMEN PELVIS FINDINGS Hepatobiliary: No focal liver abnormality is seen. No gallstones, gallbladder wall thickening, or biliary dilatation. Pancreas: Unremarkable. No pancreatic ductal dilatation or surrounding inflammatory changes. Spleen: Normal in size without focal abnormality. Adrenals/Urinary Tract: Adrenal glands are unremarkable. Kidneys are normal, without renal calculi, focal lesion, or hydronephrosis. Bladder is unremarkable. Stomach/Bowel: Gaseous distention of the stomach. Small bowel nondilated. Normal appendix. Moderate fecal material in the proximal colon, decompressed distally. Vascular/Lymphatic: Moderate atheromatous calcifications of the aorta without aneurysm or stenosis. Portal vein patent. No abdominal or pelvic adenopathy. Reproductive: Status post hysterectomy. No adnexal masses. Other: No ascites.  No free air.  Musculoskeletal: Chronic L2 compression deformity. Chronic mild L5 compression fracture deformity. Mild spondylitic changes in the lower lumbar spine. No acute fracture or worrisome bone lesion. IMPRESSION: 1. Acute appearing burst fracture of T6 vertebral body with 4 mm retropulsion into spinal canal. 2. No acute abdominal findings. 3. Chronic compression fracture deformities of L2 and L5 vertebral bodies. Electronically Signed   By: DLucrezia EuropeM.D.   On: 12/13/2017 09:22   Dg Foot Complete Left  Result Date: 12/13/2017 CLINICAL DATA:  Pt is having left foot pain, no known injury EXAM: LEFT FOOT - COMPLETE 3+ VIEW COMPARISON:  None. FINDINGS: Diffuse osteopenia which limits characterization of osseous detail, however, no fracture line or displaced fracture fragment identified and no acute or suspicious osseous lesion. No osteophytes or other signs of advanced degenerative joint disease. No erosions or other signs of an inflammatory arthritis. Soft tissues about the left foot are unremarkable. IMPRESSION: 1. No acute findings. 2. Osteopenia. Electronically Signed   By: SFranki CabotM.D.   On: 12/13/2017 14:56    Procedures Procedures (including critical care time)  Medications Ordered in ED Medications  0.9 %  sodium chloride infusion ( Intravenous New Bag/Given 12/13/17 1414)  acetaminophen (TYLENOL) tablet 650 mg (not administered)  docusate sodium (COLACE) capsule 100 mg (  100 mg Oral Not Given 12/13/17 1501)  bisacodyl (DULCOLAX) suppository 10 mg (not administered)  ondansetron (ZOFRAN-ODT) disintegrating tablet 4 mg (not administered)    Or  ondansetron (ZOFRAN) injection 4 mg (not administered)  metoprolol tartrate (LOPRESSOR) injection 5 mg (not administered)  hydrALAZINE (APRESOLINE) injection 10 mg (not administered)  traMADol (ULTRAM) tablet 50 mg (50 mg Oral Given 12/13/17 1536)  fentaNYL (SUBLIMAZE) injection 25 mcg (25 mcg Intravenous Given 12/13/17 1456)  donepezil (ARICEPT) tablet 5 mg  (not administered)  losartan (COZAAR) tablet 100 mg (not administered)  mirtazapine (REMERON) tablet 7.5 mg (not administered)  levothyroxine (SYNTHROID, LEVOTHROID) tablet 75 mcg (not administered)  fentaNYL (SUBLIMAZE) injection 50 mcg (50 mcg Intravenous Given 12/13/17 0652)  Tdap (BOOSTRIX) injection 0.5 mL (0.5 mLs Intramuscular Given 12/13/17 0645)  ondansetron (ZOFRAN) injection 4 mg (4 mg Intravenous Given 12/13/17 0727)  morphine 4 MG/ML injection 2 mg (2 mg Intravenous Given 12/13/17 0734)  iopamidol (ISOVUE-300) 61 % injection (75 mLs  Contrast Given 12/13/17 0820)  morphine 4 MG/ML injection 4 mg (4 mg Intravenous Given 12/13/17 1017)     Initial Impression / Assessment and Plan / ED Course  I have reviewed the triage vital signs and the nursing notes.  Pertinent labs & imaging results that were available during my care of the patient were reviewed by me and considered in my medical decision making (see chart for details).     Patient with Alzheimer's dementia presents for evaluation after fall.  Afebrile, vital signs are stable.  She is at her mental status baseline with no focal neurologic deficits.  She has ecchymosis, swelling, and severe tenderness to palpation of the bilateral wrists as well as epigastric and chest wall tenderness.  Imaging shows no acute abnormalities of the head or neck.   CT scan of the chest and abdomen with contrast show an acute burst fracture of T6 with 4 mm of retropulsion.  No acute abdominal findings and I doubt pancreas injury despite mildly elevated lipase.  CBC shows mild nonspecific leukocytosis.  Remainder of lab work reassuring.  Very mild pain control even with multiple doses of pain medicine.  Dr. Betsey Holiday has seen and evaluated the patient and agrees with assessment and plan. 10:10 AM Spoke with Dr. Kae Heller with trauma surgery who recommends hand and Ortho consult and she will see and evaluate patient. 10:44 AM Dr. Windle Guard agrees to assume care of  patient and bring her into the hospital for further evaluation and management.  Spoke with Joelene Millin with neurosurgery who recommends TLSO brace and will assess patient. 11:00AM Spoke with Dr. Apolonio Schneiders with hand surgery who recommends short arm splints for bilateral upper extremities and who will see the patient in person.  Appreciate all consultations.   Final Clinical Impressions(s) / ED Diagnoses   Final diagnoses:  Closed burst fracture of thoracic vertebra, initial encounter ([place])  Closed nondisplaced fracture of fifth metacarpal bone of right hand, unspecified portion of metacarpal, initial encounter  Other closed extra-articular fracture of distal end of left radius, initial encounter  Closed nondisplaced fracture of styloid process of left ulna, initial encounter    ED Discharge Orders    None       Renita Papa, PA-C 12/13/17 1606    Orpah Greek, MD 12/14/17 0451

## 2017-12-13 NOTE — ED Notes (Signed)
Patient transported to CT 

## 2017-12-13 NOTE — Progress Notes (Signed)
Orthopedic Tech Progress Note Patient Details:  [name] 19-Aug-1930 [id number]  Ortho Devices Type of Ortho Device: Ace wrap, Sugartong splint, Arm sling, Ulna gutter splint Ortho Device/Splint Interventions: Application   Post Interventions Patient Tolerated: Well Instructions Provided: Care of device   [place] Pink 12/13/2017, 9:40 AM

## 2017-12-13 NOTE — Consult Note (Signed)
Reason for Consult: T6 fracture Referring Physician: EDP  Khristine Verno is an 84 y.o. female.   HPI:  84 year old female with a history of end-stage dementia and stroke who fell down steps today. She denies back pain but her caregiver states she has been complaining of pain all morning. It is difficult to assess her because of her dementia.  Past Medical History:  Diagnosis Date  . Essential hypertension   . Multiple myeloma ([place])   . Stroke [facility])     History reviewed. No pertinent surgical history.  Allergies  Allergen Reactions  . Amoxicillin Itching  . Oxycodone Itching  . Penicillin G Rash    Social History   Tobacco Use  . Smoking status: Never Smoker  . Smokeless tobacco: Never Used  Substance Use Topics  . Alcohol use: No    History reviewed. No pertinent family history.   Review of Systems  Positive ROS: neg  All other systems have been reviewed and were otherwise negative with the exception of those mentioned in the HPI and as above.  Objective: Vital signs in last 24 hours: Temp:  [98.2 F (36.8 C)] 98.2 F (36.8 C) (03/09 0541) Pulse Rate:  [58-64] 62 (03/09 1200) Resp:  [12-20] 12 (03/09 1200) BP: (110-135)/(50-78) 131/60 (03/09 1200) SpO2:  [90 %-100 %] 98 % (03/09 1200) Weight:  [49.4 kg (109 lb)] 49.4 kg (109 lb) (03/09 0543)  General Appearance: Alert, cooperative, no distress, appears stated age Head: Normocephalic, without obvious abnormality, atraumatic Eyes: PERRL, conjunctiva/corneas clear, EOM's intact   Neck: Supple Lungs: respirations unlabored Heart: Regular rate and rhythm Abdomen: Softin color, texture, turgor normal, no rashes or lesions  NEUROLOGIC:   Mental status: Awake and regards me but really cannot assess fund of knowledge and memory Motor Exam - grossly normal, normal tone and bulk to in bed exam, right arm in a soft cast Sensory Exam - grossly normal as best I can tell Gait - not tested Balance -not tested Cranial  Nerves: I: smell Not tested  II: visual acuity  OS: na    OD: na  II: visual fields   II: pupils reactive  III,VII: ptosis None  III,IV,VI: extraocular muscles  Full ROM  V: mastication Normal  V: facial light touch sensation  Normal  V,VII: corneal reflex    VII: facial muscle function - upper  Normal  VII: facial muscle function - lower Normal  VIII: hearing Not tested  IX: soft palate elevation  Normal  IX,X: gag reflex   XI: trapezius strength  5/5  XI: sternocleidomastoid strength 5/5  XI: neck flexion strength  5/5  XII: tongue strength  Normal    Data Review Lab Results  Component Value Date   WBC 13.7 (H) 12/13/2017   HGB 11.9 (L) 12/13/2017   HCT 35.0 (L) 12/13/2017   MCV 94.1 12/13/2017   PLT 173 12/13/2017   Lab Results  Component Value Date   NA 142 12/13/2017   K 4.0 12/13/2017   CL 104 12/13/2017   CO2 24 12/13/2017   BUN 30 (H) 12/13/2017   CREATININE 1.00 12/13/2017   GLUCOSE 140 (H) 12/13/2017   Lab Results  Component Value Date   INR 1.02 09/07/2016    Radiology: Dg Chest 2 View  Result Date: 12/13/2017 CLINICAL DATA:  Status post fall down flight of stairs, with acute onset of generalized chest pain and shortness of breath. Initial encounter. EXAM: CHEST - 2 VIEW COMPARISON:  Thoracic spine radiographs performed 03/17/2013 FINDINGS:  The lungs are well-aerated. Minimal left basilar opacity may reflect atelectasis. There is no evidence of pleural effusion or pneumothorax. The heart is borderline normal in size. Compression deformities are noted at the midthoracic and upper lumbar spine, new from 2014. IMPRESSION: 1. No displaced rib fracture seen. Minimal left basilar airspace opacity may reflect atelectasis. 2. Compression deformities of the midthoracic and upper lumbar spine are new from 2014 but may still be chronic in nature. Would correlate for any associated symptoms. Electronically Signed   By: Garald Balding M.D.   On: 12/13/2017 06:54   Dg  Wrist Complete Left  Result Date: 12/13/2017 CLINICAL DATA:  Status post fall down flight of stairs, with left wrist pain and swelling. Initial encounter. EXAM: LEFT WRIST - COMPLETE 3+ VIEW COMPARISON:  None. FINDINGS: There is a mildly impacted and displaced fracture involving the distal radius, without significant angulation. An ulnar styloid fracture is also seen. There is calcification of the triangular fibrocartilage. Degenerative change is noted at the first carpometacarpal joint. Mild degenerative change is noted at the visualized interphalangeal joints. Soft tissue swelling is noted about the wrist. IMPRESSION: 1. Mildly impacted and displaced fracture involving the distal radius, without significant angulation. 2. Ulnar styloid fracture noted. 3. Calcification of the triangular fibrocartilage. Electronically Signed   By: Garald Balding M.D.   On: 12/13/2017 06:56   Dg Wrist Complete Right  Result Date: 12/13/2017 CLINICAL DATA:  Status post fall down flight of stairs, with right wrist pain and swelling. Initial encounter. EXAM: RIGHT WRIST - COMPLETE 3+ VIEW COMPARISON:  None. FINDINGS: There is a mildly displaced fracture at the base of the fifth metatarsal, without definite evidence of intra-articular extension. Degenerative change is noted at the radial aspect of the carpal rows, with sclerosis. There is calcification of the triangular fibrocartilage. No significant soft tissue abnormalities are seen. IMPRESSION: 1. Mildly displaced fracture at the base of the fifth metatarsal, without definite evidence of intra-articular extension. 2. Calcification of the triangular fibrocartilage. Electronically Signed   By: Garald Balding M.D.   On: 12/13/2017 06:57   Ct Head Wo Contrast  Result Date: 12/13/2017 CLINICAL DATA:  Fall down stairs. EXAM: CT HEAD WITHOUT CONTRAST CT CERVICAL SPINE WITHOUT CONTRAST TECHNIQUE: Multidetector CT imaging of the head and cervical spine was performed following the  standard protocol without intravenous contrast. Multiplanar CT image reconstructions of the cervical spine were also generated. COMPARISON:  04/12/2017.  CTA neck 09/07/2016 FINDINGS: CT HEAD FINDINGS Brain: There is atrophy and chronic small vessel disease changes. Associated ventriculomegaly. No acute infarction or hemorrhage. Area of encephalomalacia within the posterior right temporal and parietal lobe in area of prior hemorrhage. Vascular: No hyperdense vessel or unexpected calcification. Skull: No acute calvarial abnormality. Sinuses/Orbits: Visualized paranasal sinuses and mastoids clear. Orbital soft tissues unremarkable. Other: None CT CERVICAL SPINE FINDINGS Alignment: 2-3 mm anterolisthesis of C3 on C4, C4 on C5, and C5 on C6. This is stable since 09/07/2016 and likely related to facet arthropathy. Skull base and vertebrae: No fracture Soft tissues and spinal canal: Prevertebral soft tissues are normal. No epidural or paraspinal hematoma. Disc levels: Diffuse degenerative facet disease bilaterally. Degenerative disc disease at C6-7 with disc space narrowing and anterior spurring Upper chest: Biapical scarring. Other: Carotid artery calcifications. IMPRESSION: No acute intracranial abnormality. Atrophy, chronic small vessel disease.  Associated ventriculomegaly. Degenerative disc and facet disease. Grade 1 anterolisthesis at C3-4, C4-5 and C5-6 related to facet disease. No acute bony abnormality. Electronically Signed   By: Lennette Bihari  [place] M.D.   On: 12/13/2017 08:55   Ct Chest W Contrast  Result Date: 12/13/2017 CLINICAL DATA:  [place] down stairs.  Wrist pain.  Dementia. EXAM: CT CHEST, ABDOMEN, AND PELVIS WITH CONTRAST TECHNIQUE: Multidetector CT imaging of the chest, abdomen and pelvis was performed following the standard protocol during bolus administration of intravenous contrast. CONTRAST:  5m ISOVUE-300 IOPAMIDOL (ISOVUE-300) INJECTION 61% COMPARISON:  10/01/2017 FINDINGS: CT CHEST FINDINGS  Cardiovascular: Scattered aortic plaque. No acute findings. Heart size normal. No pericardial effusion. Mediastinum/Nodes: No mediastinal hematoma. No hilar or mediastinal adenopathy. Lungs/Pleura: No pleural effusion. Partially calcified right pleural plaque. No pneumothorax. Dependent atelectasis posteriorly in both lower lobes. Focal pleural-based consolidation/atelectasis inferomedially in the right middle lobe. Pleural based nodules in the right middle lobe less than 0.5 cm. Symmetric biapical pleuroparenchymal scarring. Lungs otherwise clear. Musculoskeletal: Acute appearing burst fracture of T6 vertebral body with greater than 60% loss of height anteriorly. Approximately 4 mm retropulsion into the spinal canal. Posterior elements appear intact. No other acute bone abnormality evident. CT ABDOMEN PELVIS FINDINGS Hepatobiliary: No focal liver abnormality is seen. No gallstones, gallbladder wall thickening, or biliary dilatation. Pancreas: Unremarkable. No pancreatic ductal dilatation or surrounding inflammatory changes. Spleen: Normal in size without focal abnormality. Adrenals/Urinary Tract: Adrenal glands are unremarkable. Kidneys are normal, without renal calculi, focal lesion, or hydronephrosis. Bladder is unremarkable. Stomach/Bowel: Gaseous distention of the stomach. Small bowel nondilated. Normal appendix. Moderate fecal material in the proximal colon, decompressed distally. Vascular/Lymphatic: Moderate atheromatous calcifications of the aorta without aneurysm or stenosis. Portal vein patent. No abdominal or pelvic adenopathy. Reproductive: Status post hysterectomy. No adnexal masses. Other: No ascites.  No free air. Musculoskeletal: Chronic L2 compression deformity. Chronic mild L5 compression fracture deformity. Mild spondylitic changes in the lower lumbar spine. No acute fracture or worrisome bone lesion. IMPRESSION: 1. Acute appearing burst fracture of T6 vertebral body with 4 mm retropulsion into  spinal canal. 2. No acute abdominal findings. 3. Chronic compression fracture deformities of L2 and L5 vertebral bodies. Electronically Signed   By: DLucrezia EuropeM.D.   On: 12/13/2017 09:22   Ct Cervical Spine Wo Contrast  Result Date: 12/13/2017 CLINICAL DATA:  Fall down stairs. EXAM: CT HEAD WITHOUT CONTRAST CT CERVICAL SPINE WITHOUT CONTRAST TECHNIQUE: Multidetector CT imaging of the head and cervical spine was performed following the standard protocol without intravenous contrast. Multiplanar CT image reconstructions of the cervical spine were also generated. COMPARISON:  04/12/2017.  CTA neck 09/07/2016 FINDINGS: CT HEAD FINDINGS Brain: There is atrophy and chronic small vessel disease changes. Associated ventriculomegaly. No acute infarction or hemorrhage. Area of encephalomalacia within the posterior right temporal and parietal lobe in area of prior hemorrhage. Vascular: No hyperdense vessel or unexpected calcification. Skull: No acute calvarial abnormality. Sinuses/Orbits: Visualized paranasal sinuses and mastoids clear. Orbital soft tissues unremarkable. Other: None CT CERVICAL SPINE FINDINGS Alignment: 2-3 mm anterolisthesis of C3 on C4, C4 on C5, and C5 on C6. This is stable since 09/07/2016 and likely related to facet arthropathy. Skull base and vertebrae: No fracture Soft tissues and spinal canal: Prevertebral soft tissues are normal. No epidural or paraspinal hematoma. Disc levels: Diffuse degenerative facet disease bilaterally. Degenerative disc disease at C6-7 with disc space narrowing and anterior spurring Upper chest: Biapical scarring. Other: Carotid artery calcifications. IMPRESSION: No acute intracranial abnormality. Atrophy, chronic small vessel disease.  Associated ventriculomegaly. Degenerative disc and facet disease. Grade 1 anterolisthesis at C3-4, C4-5 and C5-6 related to facet disease. No acute bony abnormality. Electronically Signed  By: Rolm Baptise M.D.   On: 12/13/2017 08:55   Ct  Abdomen Pelvis W Contrast  Result Date: 12/13/2017 CLINICAL DATA:  [place] down stairs.  Wrist pain.  Dementia. EXAM: CT CHEST, ABDOMEN, AND PELVIS WITH CONTRAST TECHNIQUE: Multidetector CT imaging of the chest, abdomen and pelvis was performed following the standard protocol during bolus administration of intravenous contrast. CONTRAST:  55m ISOVUE-300 IOPAMIDOL (ISOVUE-300) INJECTION 61% COMPARISON:  10/01/2017 FINDINGS: CT CHEST FINDINGS Cardiovascular: Scattered aortic plaque. No acute findings. Heart size normal. No pericardial effusion. Mediastinum/Nodes: No mediastinal hematoma. No hilar or mediastinal adenopathy. Lungs/Pleura: No pleural effusion. Partially calcified right pleural plaque. No pneumothorax. Dependent atelectasis posteriorly in both lower lobes. Focal pleural-based consolidation/atelectasis inferomedially in the right middle lobe. Pleural based nodules in the right middle lobe less than 0.5 cm. Symmetric biapical pleuroparenchymal scarring. Lungs otherwise clear. Musculoskeletal: Acute appearing burst fracture of T6 vertebral body with greater than 60% loss of height anteriorly. Approximately 4 mm retropulsion into the spinal canal. Posterior elements appear intact. No other acute bone abnormality evident. CT ABDOMEN PELVIS FINDINGS Hepatobiliary: No focal liver abnormality is seen. No gallstones, gallbladder wall thickening, or biliary dilatation. Pancreas: Unremarkable. No pancreatic ductal dilatation or surrounding inflammatory changes. Spleen: Normal in size without focal abnormality. Adrenals/Urinary Tract: Adrenal glands are unremarkable. Kidneys are normal, without renal calculi, focal lesion, or hydronephrosis. Bladder is unremarkable. Stomach/Bowel: Gaseous distention of the stomach. Small bowel nondilated. Normal appendix. Moderate fecal material in the proximal colon, decompressed distally. Vascular/Lymphatic: Moderate atheromatous calcifications of the aorta without aneurysm or  stenosis. Portal vein patent. No abdominal or pelvic adenopathy. Reproductive: Status post hysterectomy. No adnexal masses. Other: No ascites.  No free air. Musculoskeletal: Chronic L2 compression deformity. Chronic mild L5 compression fracture deformity. Mild spondylitic changes in the lower lumbar spine. No acute fracture or worrisome bone lesion. IMPRESSION: 1. Acute appearing burst fracture of T6 vertebral body with 4 mm retropulsion into spinal canal. 2. No acute abdominal findings. 3. Chronic compression fracture deformities of L2 and L5 vertebral bodies. Electronically Signed   By: DLucrezia EuropeM.D.   On: 12/13/2017 09:22     Assessment/Plan: Estimated body mass index is 21.29 kg/m as calculated from the following:   Height as of 12/11/16: 5' (1.524 m).   Weight as of this encounter: 49.4 kg (109 lb).   84year old with end-stage dementia and multiple medical problems89year old with end-stage dementia and multiple medical problems who suffered a T6 fracture who suffered a T6 fracture with a fall downstairs today. with a fall downstairs today.  Will treat in a TLSO brace.  She is not a surgical candidate. She is not a surgical candidate, and at this point the fracture does not require surgery.and at this point the fracture does not require surgery.  Her husband, who also has end-stage dementia, suffered a cervical fracture and [facility] It is certainly fine for this patient [facility] She could even follow up with primary care physician as she will simply need follow-up imaging for about 3 months and as long as the fracture remains stable can come out of the brace at that time.  May don the brace sitting.   Shiva Karis S 12/13/2017 12:19 PM

## 2017-12-13 NOTE — ED Notes (Signed)
Handoff report given to RN

## 2017-12-13 NOTE — ED Triage Notes (Signed)
Patient fell down a flight of stairs; patient has hx of dementia

## 2017-12-13 NOTE — H&P (Addendum)
Surgical H&P  CC: fall  HPI: 84yo woman with h/o stroke, Alzheimers, multiple myeloma, HTN, hemorrhagic stroke (residual L eye blindness) and T2DM who fell down about 16 stairs into her basement early this AM, shortly before ER arrival which was around 0600. Does not recall event. Brought to ER where trauma scans and plain films show bilateral wrist fx and T6 burst fx with 50m retropulsion, no other injuries. C/o back pain and wrist pain- seems improved from initial Er eval. Caregiver is nephew who is at bedside. Pt lives with him and her husband, who is at bedside and has an aspen collar on....  Allergies  Allergen Reactions  . Amoxicillin Itching  . Oxycodone Itching  . Penicillin G Rash    Past Medical History:  Diagnosis Date  . Essential hypertension   . Multiple myeloma ([facility]   . Stroke ([facility]     No past surgical history on file.  No family history on file.  Social History   Socioeconomic History  . Marital status: Unknown    Spouse name: None  . Number of children: None  . Years of education: None  . Highest education level: None  Social Needs  . Financial resource strain: None  . Food insecurity - worry: None  . Food insecurity - inability: None  . Transportation needs - medical: None  . Transportation needs - non-medical: None  Occupational History  . None  Tobacco Use  . Smoking status: Never Smoker  . Smokeless tobacco: Never Used  Substance and Sexual Activity  . Alcohol use: No  . Drug use: No  . Sexual activity: None  Other Topics Concern  . None  Social History Narrative  . None    No current facility-administered medications on file prior to encounter.    Current Outpatient Medications on File Prior to Encounter  Medication Sig Dispense Refill  . atorvastatin (LIPITOR) 10 MG tablet Take 10 mg by mouth.    . donepezil (ARICEPT) 5 MG tablet Take 5 mg by mouth daily.  5  . levothyroxine (SYNTHROID, LEVOTHROID) 75 MCG tablet Take 75 mcg by mouth  daily.    .Marland Kitchenlosartan (COZAAR) 100 MG tablet Take 100 mg by mouth daily.    . mirtazapine (REMERON) 15 MG tablet Take 7.5 mg by mouth 2 (two) times daily.     .Marland KitchenALPRAZolam (XANAX) 0.5 MG tablet Take 1 tablet (0.5 mg total) by mouth 2 (two) times daily. (Patient not taking: Reported on 12/13/2017) 2 tablet 0    Review of Systems: a complete, 10pt review of systems was completed with pertinent positives and negatives as documented in the HPI  Physical Exam: Vitals:   12/13/17 1015 12/13/17 1030  BP: (!) 132/58 (!) 118/50  Pulse: (!) 58 (!) 59  Resp: 16 13  Temp:    SpO2: 99% 97%   Gen: Alert, confused/ at baseline only oriented to self, no distress  Head: normocephalic, atraumatic, small bruise on right side of tongue Eyes: extraocular motions intact, anicteric.  Neck: supple without mass or thyromegaly, no midline tenderness Chest: unlabored respirations, symmetrical air entry, clear bilaterally, no chest wall tenderness  Cardiovascular: RRR with palpable distal pulses, no pedal edema Abdomen: soft, nondistended, nontender. No mass or organomegaly.  Extremities: warm, without edema, bilateral wrist splints in place Neuro: grossly intact, sensation and motor function to BLE grossly intact Psych: calm mood, son reports sundowning nightly Skin: warm and dry   CBC Latest Ref Rng & Units 12/13/2017 12/13/2017 09/12/2016  WBC 4.0 - 10.5 K/uL - 13.7(H) 5.3  Hemoglobin 12.0 - 15.0 g/dL 11.9(L) 11.9(L) 10.8(L)  Hematocrit 36.0 - 46.0 % 35.0(L) 36.4 32.1(L)  Platelets 150 - 400 K/uL - 173 214    CMP Latest Ref Rng & Units 12/13/2017 12/13/2017 09/12/2016  Glucose 65 - 99 mg/dL 140(H) 136(H) 98  BUN 6 - 20 mg/dL 30(H) 29(H) 14  Creatinine 0.44 - 1.00 mg/dL 1.00 0.97 0.87  Sodium 135 - 145 mmol/L 142 140 139  Potassium 3.5 - 5.1 mmol/L 4.0 3.9 3.5  Chloride 101 - 111 mmol/L 104 105 106  CO2 22 - 32 mmol/L - 24 26  Calcium 8.9 - 10.3 mg/dL - 9.2 9.0  Total Protein 6.5 - 8.1 g/dL - - -  Total  Bilirubin 0.3 - 1.2 mg/dL - - -  Alkaline Phos 38 - 126 U/L - - -  AST 15 - 41 U/L - - -  ALT 14 - 54 U/L - - -    Lab Results  Component Value Date   INR 1.02 09/07/2016    Imaging: Dg Chest 2 View  Result Date: 12/13/2017 CLINICAL DATA:  Status post fall down flight of stairs, with acute onset of generalized chest pain and shortness of breath. Initial encounter. EXAM: CHEST - 2 VIEW COMPARISON:  Thoracic spine radiographs performed 03/17/2013 FINDINGS: The lungs are well-aerated. Minimal left basilar opacity may reflect atelectasis. There is no evidence of pleural effusion or pneumothorax. The heart is borderline normal in size. Compression deformities are noted at the midthoracic and upper lumbar spine, new from 2014. IMPRESSION: 1. No displaced rib fracture seen. Minimal left basilar airspace opacity may reflect atelectasis. 2. Compression deformities of the midthoracic and upper lumbar spine are new from 2014 but may still be chronic in nature. Would correlate for any associated symptoms. Electronically Signed   By: Garald Balding M.D.   On: 12/13/2017 06:54   Dg Wrist Complete Left  Result Date: 12/13/2017 CLINICAL DATA:  Status post fall down flight of stairs, with left wrist pain and swelling. Initial encounter. EXAM: LEFT WRIST - COMPLETE 3+ VIEW COMPARISON:  None. FINDINGS: There is a mildly impacted and displaced fracture involving the distal radius, without significant angulation. An ulnar styloid fracture is also seen. There is calcification of the triangular fibrocartilage. Degenerative change is noted at the first carpometacarpal joint. Mild degenerative change is noted at the visualized interphalangeal joints. Soft tissue swelling is noted about the wrist. IMPRESSION: 1. Mildly impacted and displaced fracture involving the distal radius, without significant angulation. 2. Ulnar styloid fracture noted. 3. Calcification of the triangular fibrocartilage. Electronically Signed   By:  Garald Balding M.D.   On: 12/13/2017 06:56   Dg Wrist Complete Right  Result Date: 12/13/2017 CLINICAL DATA:  Status post fall down flight of stairs, with right wrist pain and swelling. Initial encounter. EXAM: RIGHT WRIST - COMPLETE 3+ VIEW COMPARISON:  None. FINDINGS: There is a mildly displaced fracture at the base of the fifth metatarsal, without definite evidence of intra-articular extension. Degenerative change is noted at the radial aspect of the carpal rows, with sclerosis. There is calcification of the triangular fibrocartilage. No significant soft tissue abnormalities are seen. IMPRESSION: 1. Mildly displaced fracture at the base of the fifth metatarsal, without definite evidence of intra-articular extension. 2. Calcification of the triangular fibrocartilage. Electronically Signed   By: Garald Balding M.D.   On: 12/13/2017 06:57   Ct Head Wo Contrast  Result Date: 12/13/2017 CLINICAL DATA:  Fall  down stairs. EXAM: CT HEAD WITHOUT CONTRAST CT CERVICAL SPINE WITHOUT CONTRAST TECHNIQUE: Multidetector CT imaging of the head and cervical spine was performed following the standard protocol without intravenous contrast. Multiplanar CT image reconstructions of the cervical spine were also generated. COMPARISON:  04/12/2017.  CTA neck 09/07/2016 FINDINGS: CT HEAD FINDINGS Brain: There is atrophy and chronic small vessel disease changes. Associated ventriculomegaly. No acute infarction or hemorrhage. Area of encephalomalacia within the posterior right temporal and parietal lobe in area of prior hemorrhage. Vascular: No hyperdense vessel or unexpected calcification. Skull: No acute calvarial abnormality. Sinuses/Orbits: Visualized paranasal sinuses and mastoids clear. Orbital soft tissues unremarkable. Other: None CT CERVICAL SPINE FINDINGS Alignment: 2-3 mm anterolisthesis of C3 on C4, C4 on C5, and C5 on C6. This is stable since 09/07/2016 and likely related to facet arthropathy. Skull base and vertebrae: No  fracture Soft tissues and spinal canal: Prevertebral soft tissues are normal. No epidural or paraspinal hematoma. Disc levels: Diffuse degenerative facet disease bilaterally. Degenerative disc disease at C6-7 with disc space narrowing and anterior spurring Upper chest: Biapical scarring. Other: Carotid artery calcifications. IMPRESSION: No acute intracranial abnormality. Atrophy, chronic small vessel disease.  Associated ventriculomegaly. Degenerative disc and facet disease. Grade 1 anterolisthesis at C3-4, C4-5 and C5-6 related to facet disease. No acute bony abnormality. Electronically Signed   By: Rolm Baptise M.D.   On: 12/13/2017 08:55   Ct Chest W Contrast  Result Date: 12/13/2017 CLINICAL DATA:  [place] down stairs.  Wrist pain.  Dementia. EXAM: CT CHEST, ABDOMEN, AND PELVIS WITH CONTRAST TECHNIQUE: Multidetector CT imaging of the chest, abdomen and pelvis was performed following the standard protocol during bolus administration of intravenous contrast. CONTRAST:  43m ISOVUE-300 IOPAMIDOL (ISOVUE-300) INJECTION 61% COMPARISON:  10/01/2017 FINDINGS: CT CHEST FINDINGS Cardiovascular: Scattered aortic plaque. No acute findings. Heart size normal. No pericardial effusion. Mediastinum/Nodes: No mediastinal hematoma. No hilar or mediastinal adenopathy. Lungs/Pleura: No pleural effusion. Partially calcified right pleural plaque. No pneumothorax. Dependent atelectasis posteriorly in both lower lobes. Focal pleural-based consolidation/atelectasis inferomedially in the right middle lobe. Pleural based nodules in the right middle lobe less than 0.5 cm. Symmetric biapical pleuroparenchymal scarring. Lungs otherwise clear. Musculoskeletal: Acute appearing burst fracture of T6 vertebral body with greater than 60% loss of height anteriorly. Approximately 4 mm retropulsion into the spinal canal. Posterior elements appear intact. No other acute bone abnormality evident. CT ABDOMEN PELVIS FINDINGS Hepatobiliary: No focal  liver abnormality is seen. No gallstones, gallbladder wall thickening, or biliary dilatation. Pancreas: Unremarkable. No pancreatic ductal dilatation or surrounding inflammatory changes. Spleen: Normal in size without focal abnormality. Adrenals/Urinary Tract: Adrenal glands are unremarkable. Kidneys are normal, without renal calculi, focal lesion, or hydronephrosis. Bladder is unremarkable. Stomach/Bowel: Gaseous distention of the stomach. Small bowel nondilated. Normal appendix. Moderate fecal material in the proximal colon, decompressed distally. Vascular/Lymphatic: Moderate atheromatous calcifications of the aorta without aneurysm or stenosis. Portal vein patent. No abdominal or pelvic adenopathy. Reproductive: Status post hysterectomy. No adnexal masses. Other: No ascites.  No free air. Musculoskeletal: Chronic L2 compression deformity. Chronic mild L5 compression fracture deformity. Mild spondylitic changes in the lower lumbar spine. No acute fracture or worrisome bone lesion. IMPRESSION: 1. Acute appearing burst fracture of T6 vertebral body with 4 mm retropulsion into spinal canal. 2. No acute abdominal findings. 3. Chronic compression fracture deformities of L2 and L5 vertebral bodies. Electronically Signed   By: DLucrezia EuropeM.D.   On: 12/13/2017 09:22   Ct Cervical Spine Wo Contrast  Result Date: 12/13/2017  CLINICAL DATA:  Fall down stairs. EXAM: CT HEAD WITHOUT CONTRAST CT CERVICAL SPINE WITHOUT CONTRAST TECHNIQUE: Multidetector CT imaging of the head and cervical spine was performed following the standard protocol without intravenous contrast. Multiplanar CT image reconstructions of the cervical spine were also generated. COMPARISON:  04/12/2017.  CTA neck 09/07/2016 FINDINGS: CT HEAD FINDINGS Brain: There is atrophy and chronic small vessel disease changes. Associated ventriculomegaly. No acute infarction or hemorrhage. Area of encephalomalacia within the posterior right temporal and parietal lobe in  area of prior hemorrhage. Vascular: No hyperdense vessel or unexpected calcification. Skull: No acute calvarial abnormality. Sinuses/Orbits: Visualized paranasal sinuses and mastoids clear. Orbital soft tissues unremarkable. Other: None CT CERVICAL SPINE FINDINGS Alignment: 2-3 mm anterolisthesis of C3 on C4, C4 on C5, and C5 on C6. This is stable since 09/07/2016 and likely related to facet arthropathy. Skull base and vertebrae: No fracture Soft tissues and spinal canal: Prevertebral soft tissues are normal. No epidural or paraspinal hematoma. Disc levels: Diffuse degenerative facet disease bilaterally. Degenerative disc disease at C6-7 with disc space narrowing and anterior spurring Upper chest: Biapical scarring. Other: Carotid artery calcifications. IMPRESSION: No acute intracranial abnormality. Atrophy, chronic small vessel disease.  Associated ventriculomegaly. Degenerative disc and facet disease. Grade 1 anterolisthesis at C3-4, C4-5 and C5-6 related to facet disease. No acute bony abnormality. Electronically Signed   By: Rolm Baptise M.D.   On: 12/13/2017 08:55   Ct Abdomen Pelvis W Contrast  Result Date: 12/13/2017 CLINICAL DATA:  [place] down stairs.  Wrist pain.  Dementia. EXAM: CT CHEST, ABDOMEN, AND PELVIS WITH CONTRAST TECHNIQUE: Multidetector CT imaging of the chest, abdomen and pelvis was performed following the standard protocol during bolus administration of intravenous contrast. CONTRAST:  63m ISOVUE-300 IOPAMIDOL (ISOVUE-300) INJECTION 61% COMPARISON:  10/01/2017 FINDINGS: CT CHEST FINDINGS Cardiovascular: Scattered aortic plaque. No acute findings. Heart size normal. No pericardial effusion. Mediastinum/Nodes: No mediastinal hematoma. No hilar or mediastinal adenopathy. Lungs/Pleura: No pleural effusion. Partially calcified right pleural plaque. No pneumothorax. Dependent atelectasis posteriorly in both lower lobes. Focal pleural-based consolidation/atelectasis inferomedially in the right  middle lobe. Pleural based nodules in the right middle lobe less than 0.5 cm. Symmetric biapical pleuroparenchymal scarring. Lungs otherwise clear. Musculoskeletal: Acute appearing burst fracture of T6 vertebral body with greater than 60% loss of height anteriorly. Approximately 4 mm retropulsion into the spinal canal. Posterior elements appear intact. No other acute bone abnormality evident. CT ABDOMEN PELVIS FINDINGS Hepatobiliary: No focal liver abnormality is seen. No gallstones, gallbladder wall thickening, or biliary dilatation. Pancreas: Unremarkable. No pancreatic ductal dilatation or surrounding inflammatory changes. Spleen: Normal in size without focal abnormality. Adrenals/Urinary Tract: Adrenal glands are unremarkable. Kidneys are normal, without renal calculi, focal lesion, or hydronephrosis. Bladder is unremarkable. Stomach/Bowel: Gaseous distention of the stomach. Small bowel nondilated. Normal appendix. Moderate fecal material in the proximal colon, decompressed distally. Vascular/Lymphatic: Moderate atheromatous calcifications of the aorta without aneurysm or stenosis. Portal vein patent. No abdominal or pelvic adenopathy. Reproductive: Status post hysterectomy. No adnexal masses. Other: No ascites.  No free air. Musculoskeletal: Chronic L2 compression deformity. Chronic mild L5 compression fracture deformity. Mild spondylitic changes in the lower lumbar spine. No acute fracture or worrisome bone lesion. IMPRESSION: 1. Acute appearing burst fracture of T6 vertebral body with 4 mm retropulsion into spinal canal. 2. No acute abdominal findings. 3. Chronic compression fracture deformities of L2 and L5 vertebral bodies. Electronically Signed   By: DLucrezia EuropeM.D.   On: 12/13/2017 09:22      A/P:  84yo wman s/p fall down stairs Left distal radius & ulnar styloid fx, right 5th metacarpal fx: hand consult requested by EDP Acute t6 burst fx with 17m retropulsion into spinal canal, chronic compression  deformities L2&L5: spine consult requested by EDP  Admit for pain control, PT/OT eval   CRomana Juniper MD CMinidoka Memorial HospitalSurgery, PUtahPager [phone]

## 2017-12-13 NOTE — Progress Notes (Signed)
Orthopedic Tech Progress Note Patient Details:  Judith Henderson 1930/01/18 [id number]  Patient ID: Judith Henderson, female   DOB: Feb 14, 1930, 84 y.o.   MRN: [id number]   Judith Henderson 12/13/2017, 10:57 AMCalled [employer] for TLSO brace.

## 2017-12-13 NOTE — ED Notes (Signed)
Delay in lab ,  Pt in xray.

## 2017-12-13 NOTE — Progress Notes (Addendum)
Pt was unable to void. Bladder scan amount > 500. MD Neurosurgery was paged. The verbal order for intermittent cath was given. Pt tolerated the procedure well. The urine output was 600.

## 2017-12-13 NOTE — ED Notes (Signed)
Pt returned from CT. Ortho paged to apply splints.

## 2017-12-13 NOTE — ED Notes (Signed)
Attempted report 

## 2017-12-14 ENCOUNTER — Observation Stay (HOSPITAL_COMMUNITY): Payer: Medicare Other

## 2017-12-14 LAB — CBC
HCT: 33.2 % — ABNORMAL LOW (ref 36.0–46.0)
HEMOGLOBIN: 10.7 g/dL — AB (ref 12.0–15.0)
MCH: 30 pg (ref 26.0–34.0)
MCHC: 32.2 g/dL (ref 30.0–36.0)
MCV: 93 fL (ref 78.0–100.0)
Platelets: 132 10*3/uL — ABNORMAL LOW (ref 150–400)
RBC: 3.57 MIL/uL — ABNORMAL LOW (ref 3.87–5.11)
RDW: 13.5 % (ref 11.5–15.5)
WBC: 9.7 10*3/uL (ref 4.0–10.5)

## 2017-12-14 LAB — URINALYSIS, ROUTINE W REFLEX MICROSCOPIC
Bilirubin Urine: NEGATIVE
Glucose, UA: NEGATIVE mg/dL
Ketones, ur: NEGATIVE mg/dL
Leukocytes, UA: NEGATIVE
Nitrite: NEGATIVE
Protein, ur: 30 mg/dL — AB
Specific Gravity, Urine: 1.032 — ABNORMAL HIGH (ref 1.005–1.030)
Squamous Epithelial / LPF: NONE SEEN
pH: 5 (ref 5.0–8.0)

## 2017-12-14 LAB — BASIC METABOLIC PANEL
ANION GAP: 8 (ref 5–15)
BUN: 25 mg/dL — ABNORMAL HIGH (ref 6–20)
CALCIUM: 8.6 mg/dL — AB (ref 8.9–10.3)
CHLORIDE: 105 mmol/L (ref 101–111)
CO2: 23 mmol/L (ref 22–32)
CREATININE: 0.92 mg/dL (ref 0.44–1.00)
GFR calc Af Amer: >60 mL/min (ref >60)
GFR calc non Af Amer: 55 mL/min — ABNORMAL LOW (ref >60)
GLUCOSE: 118 mg/dL — AB (ref 65–99)
Potassium: 3.9 mmol/L (ref 3.5–5.1)
Sodium: 136 mmol/L (ref 135–145)

## 2017-12-14 MED ORDER — POLYETHYLENE GLYCOL 3350 17 G PO PACK
17.0000 g | PACK | Freq: Two times a day (BID) | ORAL | Status: DC
  Administered 2017-12-14 – 2017-12-17 (×7): 17 g via ORAL
  Filled 2017-12-14 (×6): qty 1

## 2017-12-14 NOTE — NC FL2 (Signed)
[place] LEVEL OF CARE SCREENING TOOL     IDENTIFICATION  Patient Name: Judith Henderson Birthdate: November 17, 1929 Sex: female Admission Date (Current Location): 12/13/2017  [facility] and [place] Number:  Herbalist and Address:  [facility], [place] [address] [place], [place] [address]      Provider Number: [id number]  Attending Physician Name and Address:  Md, Trauma, MD  Relative Name and Phone Number:       Current Level of Care: Hospital Recommended Level of Care: [place] Prior Approval Number:    Date Approved/Denied:   PASRR Number: [id number]  Discharge Plan: SNF    Current Diagnoses: Patient Active Problem List   Diagnosis Date Noted  . Thoracic spine fracture ([place]) 12/13/2017  . Hyperlipidemia 12/12/2016  . Hemorrhagic stroke ([place])   . Diastolic dysfunction   . Diabetes mellitus type 2 in nonobese (HCC)   . Multiple myeloma ([place])   . Benign essential HTN   . Tachycardia   . Hypothyroidism   . Hypokalemia   . Hypoalbuminemia due to protein-calorie malnutrition ([place])   . Acute blood loss anemia   . Essential hypertension   . ICH (intracerebral hemorrhage) ([place]) 09/07/2016    Orientation RESPIRATION BLADDER Height & Weight     Self  O2(Nasal Cannula; 2L) Continent Weight: 109 lb (49.4 kg) Height:     BEHAVIORAL SYMPTOMS/MOOD NEUROLOGICAL BOWEL NUTRITION STATUS      Continent Diet(Regular diet, thin liquids)  AMBULATORY STATUS COMMUNICATION OF NEEDS Skin   Extensive Assist Verbally Normal                       Personal Care Assistance Level of Assistance  Bathing, Feeding, Dressing Bathing Assistance: Maximum assistance Feeding assistance: Independent Dressing Assistance: Maximum assistance     Functional Limitations Info  Sight, Hearing, Speech Sight Info: Adequate Hearing Info: Adequate Speech Info: Adequate    SPECIAL CARE FACTORS FREQUENCY  PT (By licensed PT), OT (By licensed  OT)     PT Frequency: 2x OT Frequency: 2x            Contractures Contractures Info: Not present    Additional Factors Info  Code Status, Allergies Code Status Info: Full COde Allergies Info: Amoxicillin, Oxycodone, Penicillin G           Current Medications (12/14/2017):  This is the current hospital active medication list Current Facility-Administered Medications  Medication Dose Route Frequency Provider Last Rate Last Dose  . 0.9 %  sodium chloride infusion   Intravenous Continuous Romana Juniper A, MD 50 mL/hr at 12/13/17 1414    . acetaminophen (TYLENOL) tablet 650 mg  650 mg Oral Q4H PRN Romana Juniper A, MD      . bisacodyl (DULCOLAX) suppository 10 mg  10 mg Rectal Daily PRN Romana Juniper A, MD      . docusate sodium (COLACE) capsule 100 mg  100 mg Oral BID Romana Juniper A, MD   100 mg at 12/14/17 0848  . donepezil (ARICEPT) tablet 5 mg  5 mg Oral Daily Romana Juniper A, MD   5 mg at 12/14/17 3419  . fentaNYL (SUBLIMAZE) injection 25 mcg  25 mcg Intravenous Q2H PRN Romana Juniper A, MD   25 mcg at 12/14/17 0846  . hydrALAZINE (APRESOLINE) injection 10 mg  10 mg Intravenous Q2H PRN Romana Juniper A, MD      . levothyroxine (SYNTHROID, LEVOTHROID) tablet 75 mcg  75 mcg Oral  QAC breakfast Romana Juniper A, MD   75 mcg at 12/14/17 [phone]  . metoprolol tartrate (LOPRESSOR) injection 5 mg  5 mg Intravenous Q6H PRN Romana Juniper A, MD      . mirtazapine (REMERON) tablet 7.5 mg  7.5 mg Oral BID Romana Juniper A, MD   7.5 mg at 12/14/17 0848  . ondansetron (ZOFRAN-ODT) disintegrating tablet 4 mg  4 mg Oral Q6H PRN Clovis Riley, MD       Or  . ondansetron [facility]) injection 4 mg  4 mg Intravenous Q6H PRN Romana Juniper A, MD   4 mg at 12/13/17 1711  . polyethylene glycol (MIRALAX / GLYCOLAX) packet 17 g  17 g Oral BID Earnstine Regal, PA-C      . traMADol Veatrice Bourbon) tablet 50 mg  50 mg Oral Q6H PRN Clovis Riley, MD   50 mg at 12/14/17 7340     Discharge  Medications: Please see discharge summary for a list of discharge medications.  Relevant Imaging Results:  Relevant Lab Results:   Additional Information SSN: [id number]  Eileen Stanford, LCSW

## 2017-12-14 NOTE — Progress Notes (Signed)
NEUROSURGERY PROGRESS NOTE  Doing well. Complains of appropriate back soreness. Difficult to exam with patients dementia. Lower extremities are weak 2/5 but has good tone.   Temp:  [97.7 F (36.5 C)-97.9 F (36.6 C)] 97.9 F (36.6 C) (03/10 0644) Pulse Rate:  [58-85] 68 (03/10 0644) Resp:  [12-19] 16 (03/10 0644) BP: (118-172)/(48-83) 121/48 (03/10 0644) SpO2:  [92 %-100 %] 97 % (03/10 0644)  Plan: Will get AP/Lat thoracic plain films today. Continue pain management and PT/OT  Eleonore Chiquito, NP 12/14/2017 9:12 AM

## 2017-12-14 NOTE — Progress Notes (Signed)
CC:  Fall down 16 stairs  Subjective: Pt is very confused, just moans, she is not speaking at all.  Brace is in the room.  Her abdomen is distended, but she has BS, tolerated some diet.  WE are not sure what her baseline is.  She has splints on each arm.  Planning to scan bladder again and if she is retaining insert foley.    Objective: Vital signs in last 24 hours: Temp:  [97.7 F (36.5 C)-97.9 F (36.6 C)] 97.9 F (36.6 C) (03/10 0644) Pulse Rate:  [58-85] 68 (03/10 0644) Resp:  [12-20] 16 (03/10 0644) BP: (118-172)/(48-83) 121/48 (03/10 0644) SpO2:  [92 %-100 %] 97 % (03/10 0644) Last BM Date: (pta) 360 PO 638 IV 600 urine recorded Afebrile, VSS  BP ranged from 116/54 - 172/83 off Cozaar No labs this AM  Intake/Output from previous day: 03/09 0701 - 03/10 0700 In: 998.3 [P.O.:360; I.V.:638.3] Out: 600 [Urine:600] Intake/Output this shift: No intake/output data recorded.  General appearance: cachectic and confused, not opening eyes, just moans and is not speaking.  Resp: clear to auscultation bilaterally and anterior GI: distended with a few BS. Extremities: no tenderness, or edema  Lab Results:  Recent Labs    12/13/17 0620 12/13/17 0652  WBC 13.7*  --   HGB 11.9* 11.9*  HCT 36.4 35.0*  PLT 173  --     BMET Recent Labs    12/13/17 0620 12/13/17 0652  NA 140 142  K 3.9 4.0  CL 105 104  CO2 24  --   GLUCOSE 136* 140*  BUN 29* 30*  CREATININE 0.97 1.00  CALCIUM 9.2  --    PT/INR No results for input(s): LABPROT, INR in the last 72 hours.  No results for input(s): AST, ALT, ALKPHOS, BILITOT, PROT, ALBUMIN in the last 168 hours.   Lipase     Component Value Date/Time   LIPASE 99 (H) 12/13/2017 0620     Prior to Admission medications   Medication Sig Start Date End Date Taking? Authorizing Provider  atorvastatin (LIPITOR) 10 MG tablet Take 10 mg by mouth. 11/08/16  Yes [provider]  donepezil (ARICEPT) 5 MG tablet Take 5 mg by  mouth daily. 12/02/17  Yes [provider]  levothyroxine (SYNTHROID, LEVOTHROID) 75 MCG tablet Take 75 mcg by mouth daily. 08/21/16  Yes [provider]  losartan (COZAAR) 100 MG tablet Take 100 mg by mouth daily. 08/23/16  Yes [provider]  mirtazapine (REMERON) 15 MG tablet Take 7.5 mg by mouth 2 (two) times daily.  08/21/16  Yes [provider]    Medications: . docusate sodium  100 mg Oral BID  . donepezil  5 mg Oral Daily  . levothyroxine  75 mcg Oral QAC breakfast  . losartan  100 mg Oral Daily  . mirtazapine  7.5 mg Oral BID   . sodium chloride 50 mL/hr at 12/13/17 1414   Anti-infectives (From admission, onward)   None      Assessment/Plan History of hemorrhagic stroke (left eye blindness) Alzheimer's disease  - Aricept Dementia Multiple myeloma Type 2 diabetes -no treatment listed Hypertension -Cozaar at home - holding Cozaar here currently Hypothyroid -Synthroid   Fall down 16 stairs  Left distal radius & ulnar styloid fx, right 5th metacarpal fx  - Splints - can be removed for bathing  - Follow up Dr. Caralyn Guile 2 week  Acute t6 burst fx with 36m retropulsion into spinal canal, chronic compression deformities L2&L5  -  TSLO brace - OOB  -  Follow up Dr. Sherley Bounds, or [facility] neurosurgeon following Husband after a fall also  FEN:  IV fluids/Regular diet ID:  None DVT:  SCD Foley:  I/o cath Follow up:  Dr. Jobe Igo of choice    Plan:  Scan bladder, probable foley placement.  I am holding her Cozaar.  OT and PT to see but I doubt we will get very far with any therapies.      LOS: 0 days    Tanyah Debruyne 12/14/2017 [phone]

## 2017-12-14 NOTE — Clinical Social Work Note (Signed)
Clinical Social Work Assessment  Patient Details  Name: Judith Henderson MRN: [id number] Date of Birth: [birth year]  Date of referral:  12/14/17               Reason for consult:  Facility Placement                Permission sought to share information with:  Family Supports Permission granted to share information::  Yes, Release of Information Signed  Name::     Education officer, community::     Relationship::  Brother  Sport and exercise psychologist Information:     Housing/Transportation Living arrangements for the past 2 months:  Single Family Home Source of Information:  Adult Children, Power of Attorney Patient Interpreter Needed:  None Criminal Activity/Legal Involvement Pertinent to Current Situation/Hospitalization:  No - Comment as needed Significant Relationships:  Adult Children Lives with:  Spouse, Other (Comment)(Nephew) Do you feel safe going back to the place where you live?    Need for family participation in patient care:     Care giving concerns:  Pt is only alert to self. CSW reached out to pt's brother via telephone.   Social Worker assessment / plan:  CSW spoke with pt's brother. Pt was living with her spouse and newphew. Pt' brother has previously been the [facility] however, had given up those rights March 1st. Pt's brother has provided nephew with the paperwork he needed to complete to take over. Per pt's brother, pt's nephew is to submit the paperwork soon. Pt's brother request that CSW send pt's referral to [facility] as this is where his mother has been for several years. Family request pt be placed in [facility]. Pt's brother suggest CSW contact pt's nephew with b/o. CSW to provide b/o once available.   Employment status:  Retired Nurse, adult PT Recommendations:  [place] / Referral to community resources:  [place]  Patient/Family's Response to care:  Pt's brother verbalized understanding of CSW role and expressed  appreciation for support. Pt's brother denies any concern regarding pt care at this time.   Patient/Family's Understanding of and Emotional Response to Diagnosis, Current Treatment, and Prognosis:  Pt's brother understanding and realistic regarding pt's physical limitations. Pt's brother understands the pt's need for SNF placement at d/c. Pt's brother agreeable for pt to go to SNF at d/c, at this time.  Pt's brother denies any concern regarding treatment plan at this time. CSW will continue to provide support and facilitate d/c needs.   Emotional Assessment Appearance:  Appears stated age Attitude/Demeanor/Rapport:  Unable to Assess Affect (typically observed):  Unable to Assess Orientation:  Oriented to Self Alcohol / Substance use:  Not Applicable Psych involvement (Current and /or in the community):  No (Comment)  Discharge Needs  Concerns to be addressed:  Basic Needs, Care Coordination Readmission within the last 30 days:  No Current discharge risk:  Dependent with Mobility Barriers to Discharge:  Continued Medical Work up   [employer], LCSW 12/14/2017, 11:46 AM

## 2017-12-14 NOTE — Evaluation (Signed)
Occupational Therapy Evaluation Patient Details Name: Judith Henderson MRN: [id number] DOB: [birth year] Today's Date: 12/14/2017    History of Present Illness Pt is an 84 y.o. female presenting after falling down ~16 stairs. Pt found to have bil wrist fractures and T6 burst fracture; all being treated non-operatively. PMHx: CVA with residual L eye blindness, Alzheimers, multiple myelomia, HTN, DM.   Clinical Impression   Unsure of pts PLOF; pt is a poor historian and no family present. Currently pt overall max-total assist for ADL and bed level mobility. Pt tolerated sitting EOB x5 minutes with min assist throughout; SpO2 in low 90s on 2L supplemental O2. Recommending SNF for follow up to maximize independence and safety with ADL and functional mobility. Pt would benefit from continued skilled OT to address established goals.    Follow Up Recommendations  SNF;Supervision/Assistance - 24 hour    Equipment Recommendations  Other (comment)(TBD at next venue)    Recommendations for Other Services       Precautions / Restrictions Precautions Precautions: Fall;Back Precaution Booklet Issued: No Precaution Comments: Maintained back precautions throughout session Required Braces or Orthoses: Spinal Brace;Other Brace/Splint Spinal Brace: Thoracolumbosacral orthotic;Applied in sitting position Other Brace/Splint: bil wrist splints Restrictions Weight Bearing Restrictions: Yes LUE Weight Bearing: Non weight bearing      Mobility Bed Mobility Overal bed mobility: Needs Assistance Bed Mobility: Rolling;Sidelying to Sit;Sit to Sidelying Rolling: Max assist Sidelying to sit: Max assist     Sit to sidelying: Max assist General bed mobility comments: Pt attempting to assist with RUE but overall max assist for all aspects of bed mobility. Cues throughout for sequencing and safety.  Transfers                 General transfer comment: Unsafe to attempt at this time    Balance Overall  balance assessment: Needs assistance;History of Falls Sitting-balance support: Feet supported Sitting balance-Leahy Scale: Poor Sitting balance - Comments: Min assist throughout for sitting balance                                   ADL either performed or assessed with clinical judgement   ADL Overall ADL's : Needs assistance/impaired     Grooming: Maximal assistance;Sitting;Wash/dry face                                 General ADL Comments: Pt currently max-total assist overall for ADL     Vision         Perception     Praxis      Pertinent Vitals/Pain Pain Assessment: Faces Faces Pain Scale: Hurts whole lot Pain Location: "all over" Pain Descriptors / Indicators: Aching;Grimacing;Guarding Pain Intervention(s): Monitored during session;Limited activity within patient's tolerance;Patient requesting pain meds-RN notified     Hand Dominance     Extremity/Trunk Assessment Upper Extremity Assessment Upper Extremity Assessment: RUE deficits/detail;LUE deficits/detail;Difficult to assess due to impaired cognition RUE: Unable to fully assess due to immobilization;Unable to fully assess due to pain LUE: Unable to fully assess due to immobilization;Unable to fully assess due to pain   Lower Extremity Assessment Lower Extremity Assessment: Defer to PT evaluation   Cervical / Trunk Assessment Cervical / Trunk Assessment: Kyphotic;Other exceptions Cervical / Trunk Exceptions: T6 burst fx   Communication Communication Communication: HOH   Cognition Arousal/Alertness: Awake/alert Behavior During Therapy: Anxious Overall Cognitive Status: No family/caregiver  present to determine baseline cognitive functioning Area of Impairment: Orientation;Memory;Following commands;Problem solving                 Orientation Level: Disoriented to;Place;Time;Situation   Memory: Decreased short-term memory Following Commands: Follows one step commands  inconsistently     Problem Solving: Decreased initiation;Difficulty sequencing;Requires verbal cues;Requires tactile cues     General Comments  SpO2 in low 90s throughout on 2L supplemental O2    Exercises     Shoulder Instructions      Home Living Family/patient expects to be discharged to:: Unsure                                        Prior Functioning/Environment          Comments: Unsure; pt unable to provide information and no family present.        OT Problem List: Decreased strength;Decreased range of motion;Decreased activity tolerance;Impaired balance (sitting and/or standing);Decreased cognition;Decreased safety awareness;Decreased knowledge of use of DME or AE;Decreased knowledge of precautions;Cardiopulmonary status limiting activity;Impaired UE functional use;Pain;Increased edema      OT Treatment/Interventions: Self-care/ADL training;Therapeutic exercise;Energy conservation;DME and/or AE instruction;Therapeutic activities;Patient/family education;Balance training    OT Goals(Current goals can be found in the care plan section) Acute Rehab OT Goals Patient Stated Goal: decrease pain OT Goal Formulation: With patient Time For Goal Achievement: 12/28/17 Potential to Achieve Goals: Good ADL Goals Pt Will Perform Eating: with min assist;sitting Pt Will Perform Grooming: with min assist;sitting Pt Will Transfer to Toilet: with max assist;stand pivot transfer;bedside commode  OT Frequency: Min 2X/week   Barriers to D/C:            Co-evaluation              AM-PAC PT "6 Clicks" Daily Activity     Outcome Measure Help from another person eating meals?: Total Help from another person taking care of personal grooming?: A Lot Help from another person toileting, which includes using toliet, bedpan, or urinal?: Total Help from another person bathing (including washing, rinsing, drying)?: Total Help from another person to put on and  taking off regular upper body clothing?: Total Help from another person to put on and taking off regular lower body clothing?: Total 6 Click Score: 7   End of Session Equipment Utilized During Treatment: Oxygen Nurse Communication: Mobility status;Patient requests pain meds;Weight bearing status;Precautions  Activity Tolerance: Patient limited by pain Patient left: in bed;with call bell/phone within reach;with bed alarm set;with SCD's reapplied  OT Visit Diagnosis: Other abnormalities of gait and mobility (R26.89);Unsteadiness on feet (R26.81);History of falling (Z91.81);Muscle weakness (generalized) (M62.81);Pain Pain - part of body: Arm(back)                Time: 2330-0762 OT Time Calculation (min): 17 min Charges:  OT General Charges $OT Visit: 1 Visit OT Evaluation $OT Eval Moderate Complexity: 1 Mod G-Codes:     [place] Reta A. Ulice Brilliant, M.S., OTR/L Pager: [place] 12/14/2017, 9:21 AM

## 2017-12-14 NOTE — Progress Notes (Signed)
Physical Therapy Evaluation Patient Details Name: Judith Henderson MRN: [id number] DOB: 1930/10/06 Today's Date: 12/14/2017   History of Present Illness  Pt is an 84 y.o. female presenting after falling down ~16 stairs. Pt found to have bil wrist fractures and T6 burst fracture; all being treated non-operatively. PMHx: CVA with residual L eye blindness, Alzheimers, multiple myelomia, HTN, DM.  Clinical Impression  Pt admitted with above diagnosis. Pt currently with functional limitations due to the deficits listed below (see PT Problem List). Patient is a poor historian and unable to state PLOF - no family present for evaluation. Patient today only able to achieve EOB sitting, poor tolerance to this with pain primary limiting factor. Likely to need short term SNF placement to maximize functional mobility. Pt will benefit from skilled PT to increase their independence and safety with mobility to allow discharge to the venue listed below.       Follow Up Recommendations SNF;Supervision/Assistance - 24 hour    Equipment Recommendations  Other (comment)(will determine at next venue of care)    Recommendations for Other Services OT consult     Precautions / Restrictions Precautions Precautions: Fall;Back Precaution Booklet Issued: No Precaution Comments: Maintained back precautions throughout session Required Braces or Orthoses: Spinal Brace;Other Brace/Splint Spinal Brace: Thoracolumbosacral orthotic;Applied in sitting position Other Brace/Splint: bil wrist splints Restrictions Weight Bearing Restrictions: Yes LUE Weight Bearing: Non weight bearing      Mobility  Bed Mobility Overal bed mobility: Needs Assistance Bed Mobility: Supine to Sit;Rolling Rolling: Mod assist Sidelying to sit: Max assist Supine to sit: Mod assist   Sit to sidelying: Max assist General bed mobility comments: Patient in bed with HOB elevated. Able to attempt to bring B LE to EOB; spinal precautions  maintained; heavy VC for sequencing and safety  Transfers                 General transfer comment: deferred due to safety concerns  Ambulation/Gait                Stairs            Wheelchair Mobility    Modified Rankin (Stroke Patients Only)       Balance Overall balance assessment: Needs assistance;History of Falls Sitting-balance support: Feet supported Sitting balance-Leahy Scale: Poor Sitting balance - Comments: Min assist throughout for sitting balance                                     Pertinent Vitals/Pain Pain Assessment: Faces Faces Pain Scale: Hurts even more Pain Location: back Pain Descriptors / Indicators: Aching;Grimacing;Guarding;Moaning Pain Intervention(s): Limited activity within patient's tolerance;Monitored during session;Repositioned    Home Living Family/patient expects to be discharged to:: Unsure                      Prior Function           Comments: Unsure; pt unable to provide information and no family present.     Hand Dominance        Extremity/Trunk Assessment   Upper Extremity Assessment Upper Extremity Assessment: Defer to OT evaluation RUE: Unable to fully assess due to immobilization;Unable to fully assess due to pain LUE: Unable to fully assess due to immobilization;Unable to fully assess due to pain    Lower Extremity Assessment Lower Extremity Assessment: Generalized weakness    Cervical / Trunk Assessment Cervical / Trunk Assessment:  Kyphotic;Other exceptions Cervical / Trunk Exceptions: T6 burst fx  Communication   Communication: HOH  Cognition Arousal/Alertness: Awake/alert Behavior During Therapy: Restless;Anxious Overall Cognitive Status: No family/caregiver present to determine baseline cognitive functioning Area of Impairment: Orientation;Memory;Following commands;Problem solving                 Orientation Level: Disoriented to;Place;Time;Situation    Memory: Decreased short-term memory Following Commands: Follows one step commands inconsistently     Problem Solving: Decreased initiation;Difficulty sequencing;Requires verbal cues;Requires tactile cues        General Comments General comments (skin integrity, edema, etc.): SpO2 in low 90s throughout on 2L supplemental O2    Exercises     Assessment/Plan    PT Assessment Patient needs continued PT services  PT Problem List Decreased strength;Decreased activity tolerance;Decreased balance;Decreased mobility;Decreased knowledge of use of DME;Decreased safety awareness       PT Treatment Interventions DME instruction;Gait training;Functional mobility training;Therapeutic activities;Therapeutic exercise;Balance training;Patient/family education    PT Goals (Current goals can be found in the Care Plan section)  Acute Rehab PT Goals Patient Stated Goal: decrease pain PT Goal Formulation: With patient Time For Goal Achievement: 12/28/17 Potential to Achieve Goals: Fair    Frequency Min 3X/week   Barriers to discharge   unsure at this time    Co-evaluation               AM-PAC PT "6 Clicks" Daily Activity  Outcome Measure Difficulty turning over in bed (including adjusting bedclothes, sheets and blankets)?: Unable Difficulty moving from lying on back to sitting on the side of the bed? : Unable Difficulty sitting down on and standing up from a chair with arms (e.g., wheelchair, bedside commode, etc,.)?: Unable Help needed moving to and from a bed to chair (including a wheelchair)?: Total Help needed walking in hospital room?: Total Help needed climbing 3-5 steps with a railing? : Total 6 Click Score: 6    End of Session Equipment Utilized During Treatment: Back brace Activity Tolerance: Patient limited by pain;Patient limited by fatigue Patient left: in bed;with call bell/phone within reach;with bed alarm set;with SCD's reapplied Nurse Communication: Mobility  status PT Visit Diagnosis: Unsteadiness on feet (R26.81);Other abnormalities of gait and mobility (R26.89);History of falling (Z91.81);Muscle weakness (generalized) (M62.81);Difficulty in walking, not elsewhere classified (R26.2)    Time: 6962-9528 PT Time Calculation (min) (ACUTE ONLY): 16 min   Charges:   PT Evaluation $PT Eval Moderate Complexity: 1 Mod     PT G Codes:     Lanney Gins, PT, DPT 12/14/17 12:03 PM

## 2017-12-15 LAB — BASIC METABOLIC PANEL
ANION GAP: 8 (ref 5–15)
BUN: 19 mg/dL (ref 6–20)
CALCIUM: 8.6 mg/dL — AB (ref 8.9–10.3)
CO2: 23 mmol/L (ref 22–32)
Chloride: 106 mmol/L (ref 101–111)
Creatinine, Ser: 0.72 mg/dL (ref 0.44–1.00)
Glucose, Bld: 103 mg/dL — ABNORMAL HIGH (ref 65–99)
Potassium: 4 mmol/L (ref 3.5–5.1)
SODIUM: 137 mmol/L (ref 135–145)

## 2017-12-15 LAB — CBC
HCT: 31.6 % — ABNORMAL LOW (ref 36.0–46.0)
Hemoglobin: 10.3 g/dL — ABNORMAL LOW (ref 12.0–15.0)
MCH: 30.1 pg (ref 26.0–34.0)
MCHC: 32.6 g/dL (ref 30.0–36.0)
MCV: 92.4 fL (ref 78.0–100.0)
PLATELETS: 111 10*3/uL — AB (ref 150–400)
RBC: 3.42 MIL/uL — ABNORMAL LOW (ref 3.87–5.11)
RDW: 13.3 % (ref 11.5–15.5)
WBC: 8.2 10*3/uL (ref 4.0–10.5)

## 2017-12-15 MED ORDER — SODIUM CHLORIDE 0.9 % IV BOLUS (SEPSIS)
500.0000 mL | Freq: Once | INTRAVENOUS | Status: AC
  Administered 2017-12-15: 500 mL via INTRAVENOUS

## 2017-12-15 MED ORDER — LOSARTAN POTASSIUM 50 MG PO TABS
100.0000 mg | ORAL_TABLET | Freq: Every day | ORAL | Status: DC
  Administered 2017-12-15 – 2017-12-17 (×3): 100 mg via ORAL
  Filled 2017-12-15 (×3): qty 2

## 2017-12-15 NOTE — Plan of Care (Signed)
  Progressing Education: Knowledge of General Education information will improve 12/15/2017 1457 - Progressing by Rance Muir, RN Health Behavior/Discharge Planning: Ability to manage health-related needs will improve 12/15/2017 1457 - Progressing by Rance Muir, RN Clinical Measurements: Ability to maintain clinical measurements within normal limits will improve 12/15/2017 1457 - Progressing by Rance Muir, RN Will remain free from infection 12/15/2017 1457 - Progressing by Rance Muir, RN Diagnostic test results will improve 12/15/2017 1457 - Progressing by Rance Muir, RN Respiratory complications will improve 12/15/2017 1457 - Progressing by Rance Muir, RN Cardiovascular complication will be avoided 12/15/2017 1457 - Progressing by Rance Muir, RN Activity: Risk for activity intolerance will decrease 12/15/2017 1457 - Progressing by Rance Muir, RN Nutrition: Adequate nutrition will be maintained 12/15/2017 1457 - Progressing by Rance Muir, RN Coping: Level of anxiety will decrease 12/15/2017 1457 - Progressing by Rance Muir, RN Elimination: Will not experience complications related to bowel motility 12/15/2017 1457 - Progressing by Rance Muir, RN Will not experience complications related to urinary retention 12/15/2017 1457 - Progressing by Rance Muir, RN Pain Managment: General experience of comfort will improve 12/15/2017 1457 - Progressing by Rance Muir, RN Safety: Ability to remain free from injury will improve 12/15/2017 1457 - Progressing by Rance Muir, RN Skin Integrity: Risk for impaired skin integrity will decrease 12/15/2017 1457 - Progressing by Rance Muir, RN

## 2017-12-15 NOTE — Progress Notes (Signed)
Patient ID: Judith Henderson, female   DOB: [birth year], 84 y.o.   MRN: [id number] Arouses easily, pleasant, denies back pain, wiggles feet, xrays reviewed and agree with report, hopefully will heal in a brace, I do not feel she's a surgical candidate and her care taker wishes for no surgery, there is some risk of fracture progression and neuro compromise, but I think this rik is smaller than surgical risk

## 2017-12-15 NOTE — Progress Notes (Signed)
[facility] Progress Note     Subjective: CC-  Patient confused. Denies any pain but moans when she moves around in bed. Has not yet eaten breakfast. No BM recorded since admission. Foley placed yesterday for urinary retention, and she only has 600cc UOP recorded for last 24 hours.  Objective: Vital signs in last 24 hours: Temp:  [97.2 F (36.2 C)-98.7 F (37.1 C)] 98.7 F (37.1 C) (03/11 0603) Pulse Rate:  [67-75] 75 (03/11 0603) Resp:  [16] 16 (03/11 0603) BP: (115-161)/(65-80) 161/80 (03/11 0603) SpO2:  [93 %-97 %] 97 % (03/11 0603) Last BM Date: (PTA)  Intake/Output from previous day: 03/10 0701 - 03/11 0700 In: 958.3 [P.O.:340; I.V.:618.3] Out: 600 [Urine:600] Intake/Output this shift: No intake/output data recorded.  PE: Gen:  Alert, NAD HEENT: EOM's intact, pupils equal and round Card:  RRR, no M/G/R heard Pulm:  CTAB, no W/R/R, effort normal Abd: Soft, mild distension, NT, +BS, no HSM, no hernia BUE: Splints in place BUE, fingers WWP, able to wiggle fingers BLE: No calf edema or tenderness. Able to wiggle toes  Lab Results:  Recent Labs    12/13/17 0620 12/13/17 0652 12/14/17 1356  WBC 13.7*  --  9.7  HGB 11.9* 11.9* 10.7*  HCT 36.4 35.0* 33.2*  PLT 173  --  132*   BMET Recent Labs    12/13/17 0620 12/13/17 0652 12/14/17 1356  NA 140 142 136  K 3.9 4.0 3.9  CL 105 104 105  CO2 24  --  23  GLUCOSE 136* 140* 118*  BUN 29* 30* 25*  CREATININE 0.97 1.00 0.92  CALCIUM 9.2  --  8.6*   PT/INR No results for input(s): LABPROT, INR in the last 72 hours. CMP     Component Value Date/Time   NA 136 12/14/2017 1356   K 3.9 12/14/2017 1356   CL 105 12/14/2017 1356   CO2 23 12/14/2017 1356   GLUCOSE 118 (H) 12/14/2017 1356   BUN 25 (H) 12/14/2017 1356   CREATININE 0.92 12/14/2017 1356   CALCIUM 8.6 (L) 12/14/2017 1356   PROT 5.9 (L) 09/09/2016 0557   ALBUMIN 3.3 (L) 09/09/2016 0557   AST 20 09/09/2016 0557   ALT 13 (L) 09/09/2016  0557   ALKPHOS 34 (L) 09/09/2016 0557   BILITOT 0.9 09/09/2016 0557   GFRNONAA 55 (L) 12/14/2017 1356   GFRAA >60 12/14/2017 1356   Lipase     Component Value Date/Time   LIPASE 99 (H) 12/13/2017 0620       Studies/Results: Dg Thoracic Spine 2 View  Result Date: 12/14/2017 CLINICAL DATA:  Thoracic compression fracture EXAM: THORACIC SPINE 2 VIEWS COMPARISON:  CT 12/13/2017 FINDINGS: Severe compression fracture noted in the midthoracic spine, likely T6 level. Kyphotic deformity centered at this level. No subluxation. No visible retropulsed fracture fragments. IMPRESSION: Severe compression deformity at T6. Electronically Signed   By: Rolm Baptise M.D.   On: 12/14/2017 10:01   Ct Head Wo Contrast  Result Date: 12/13/2017 CLINICAL DATA:  Fall down stairs. EXAM: CT HEAD WITHOUT CONTRAST CT CERVICAL SPINE WITHOUT CONTRAST TECHNIQUE: Multidetector CT imaging of the head and cervical spine was performed following the standard protocol without intravenous contrast. Multiplanar CT image reconstructions of the cervical spine were also generated. COMPARISON:  04/12/2017.  CTA neck 09/07/2016 FINDINGS: CT HEAD FINDINGS Brain: There is atrophy and chronic small vessel disease changes. Associated ventriculomegaly. No acute infarction or hemorrhage. Area of encephalomalacia within the posterior right temporal and parietal lobe in  area of prior hemorrhage. Vascular: No hyperdense vessel or unexpected calcification. Skull: No acute calvarial abnormality. Sinuses/Orbits: Visualized paranasal sinuses and mastoids clear. Orbital soft tissues unremarkable. Other: None CT CERVICAL SPINE FINDINGS Alignment: 2-3 mm anterolisthesis of C3 on C4, C4 on C5, and C5 on C6. This is stable since 09/07/2016 and likely related to facet arthropathy. Skull base and vertebrae: No fracture Soft tissues and spinal canal: Prevertebral soft tissues are normal. No epidural or paraspinal hematoma. Disc levels: Diffuse degenerative facet  disease bilaterally. Degenerative disc disease at C6-7 with disc space narrowing and anterior spurring Upper chest: Biapical scarring. Other: Carotid artery calcifications. IMPRESSION: No acute intracranial abnormality. Atrophy, chronic small vessel disease.  Associated ventriculomegaly. Degenerative disc and facet disease. Grade 1 anterolisthesis at C3-4, C4-5 and C5-6 related to facet disease. No acute bony abnormality. Electronically Signed   By: Rolm Baptise M.D.   On: 12/13/2017 08:55   Ct Chest W Contrast  Result Date: 12/13/2017 CLINICAL DATA:  [place] down stairs.  Wrist pain.  Dementia. EXAM: CT CHEST, ABDOMEN, AND PELVIS WITH CONTRAST TECHNIQUE: Multidetector CT imaging of the chest, abdomen and pelvis was performed following the standard protocol during bolus administration of intravenous contrast. CONTRAST:  64m ISOVUE-300 IOPAMIDOL (ISOVUE-300) INJECTION 61% COMPARISON:  10/01/2017 FINDINGS: CT CHEST FINDINGS Cardiovascular: Scattered aortic plaque. No acute findings. Heart size normal. No pericardial effusion. Mediastinum/Nodes: No mediastinal hematoma. No hilar or mediastinal adenopathy. Lungs/Pleura: No pleural effusion. Partially calcified right pleural plaque. No pneumothorax. Dependent atelectasis posteriorly in both lower lobes. Focal pleural-based consolidation/atelectasis inferomedially in the right middle lobe. Pleural based nodules in the right middle lobe less than 0.5 cm. Symmetric biapical pleuroparenchymal scarring. Lungs otherwise clear. Musculoskeletal: Acute appearing burst fracture of T6 vertebral body with greater than 60% loss of height anteriorly. Approximately 4 mm retropulsion into the spinal canal. Posterior elements appear intact. No other acute bone abnormality evident. CT ABDOMEN PELVIS FINDINGS Hepatobiliary: No focal liver abnormality is seen. No gallstones, gallbladder wall thickening, or biliary dilatation. Pancreas: Unremarkable. No pancreatic ductal dilatation or  surrounding inflammatory changes. Spleen: Normal in size without focal abnormality. Adrenals/Urinary Tract: Adrenal glands are unremarkable. Kidneys are normal, without renal calculi, focal lesion, or hydronephrosis. Bladder is unremarkable. Stomach/Bowel: Gaseous distention of the stomach. Small bowel nondilated. Normal appendix. Moderate fecal material in the proximal colon, decompressed distally. Vascular/Lymphatic: Moderate atheromatous calcifications of the aorta without aneurysm or stenosis. Portal vein patent. No abdominal or pelvic adenopathy. Reproductive: Status post hysterectomy. No adnexal masses. Other: No ascites.  No free air. Musculoskeletal: Chronic L2 compression deformity. Chronic mild L5 compression fracture deformity. Mild spondylitic changes in the lower lumbar spine. No acute fracture or worrisome bone lesion. IMPRESSION: 1. Acute appearing burst fracture of T6 vertebral body with 4 mm retropulsion into spinal canal. 2. No acute abdominal findings. 3. Chronic compression fracture deformities of L2 and L5 vertebral bodies. Electronically Signed   By: DLucrezia EuropeM.D.   On: 12/13/2017 09:22   Ct Cervical Spine Wo Contrast  Result Date: 12/13/2017 CLINICAL DATA:  Fall down stairs. EXAM: CT HEAD WITHOUT CONTRAST CT CERVICAL SPINE WITHOUT CONTRAST TECHNIQUE: Multidetector CT imaging of the head and cervical spine was performed following the standard protocol without intravenous contrast. Multiplanar CT image reconstructions of the cervical spine were also generated. COMPARISON:  04/12/2017.  CTA neck 09/07/2016 FINDINGS: CT HEAD FINDINGS Brain: There is atrophy and chronic small vessel disease changes. Associated ventriculomegaly. No acute infarction or hemorrhage. Area of encephalomalacia within the posterior right temporal  and parietal lobe in area of prior hemorrhage. Vascular: No hyperdense vessel or unexpected calcification. Skull: No acute calvarial abnormality. Sinuses/Orbits: Visualized  paranasal sinuses and mastoids clear. Orbital soft tissues unremarkable. Other: None CT CERVICAL SPINE FINDINGS Alignment: 2-3 mm anterolisthesis of C3 on C4, C4 on C5, and C5 on C6. This is stable since 09/07/2016 and likely related to facet arthropathy. Skull base and vertebrae: No fracture Soft tissues and spinal canal: Prevertebral soft tissues are normal. No epidural or paraspinal hematoma. Disc levels: Diffuse degenerative facet disease bilaterally. Degenerative disc disease at C6-7 with disc space narrowing and anterior spurring Upper chest: Biapical scarring. Other: Carotid artery calcifications. IMPRESSION: No acute intracranial abnormality. Atrophy, chronic small vessel disease.  Associated ventriculomegaly. Degenerative disc and facet disease. Grade 1 anterolisthesis at C3-4, C4-5 and C5-6 related to facet disease. No acute bony abnormality. Electronically Signed   By: Rolm Baptise M.D.   On: 12/13/2017 08:55   Ct Abdomen Pelvis W Contrast  Result Date: 12/13/2017 CLINICAL DATA:  [place] down stairs.  Wrist pain.  Dementia. EXAM: CT CHEST, ABDOMEN, AND PELVIS WITH CONTRAST TECHNIQUE: Multidetector CT imaging of the chest, abdomen and pelvis was performed following the standard protocol during bolus administration of intravenous contrast. CONTRAST:  83m ISOVUE-300 IOPAMIDOL (ISOVUE-300) INJECTION 61% COMPARISON:  10/01/2017 FINDINGS: CT CHEST FINDINGS Cardiovascular: Scattered aortic plaque. No acute findings. Heart size normal. No pericardial effusion. Mediastinum/Nodes: No mediastinal hematoma. No hilar or mediastinal adenopathy. Lungs/Pleura: No pleural effusion. Partially calcified right pleural plaque. No pneumothorax. Dependent atelectasis posteriorly in both lower lobes. Focal pleural-based consolidation/atelectasis inferomedially in the right middle lobe. Pleural based nodules in the right middle lobe less than 0.5 cm. Symmetric biapical pleuroparenchymal scarring. Lungs otherwise clear.  Musculoskeletal: Acute appearing burst fracture of T6 vertebral body with greater than 60% loss of height anteriorly. Approximately 4 mm retropulsion into the spinal canal. Posterior elements appear intact. No other acute bone abnormality evident. CT ABDOMEN PELVIS FINDINGS Hepatobiliary: No focal liver abnormality is seen. No gallstones, gallbladder wall thickening, or biliary dilatation. Pancreas: Unremarkable. No pancreatic ductal dilatation or surrounding inflammatory changes. Spleen: Normal in size without focal abnormality. Adrenals/Urinary Tract: Adrenal glands are unremarkable. Kidneys are normal, without renal calculi, focal lesion, or hydronephrosis. Bladder is unremarkable. Stomach/Bowel: Gaseous distention of the stomach. Small bowel nondilated. Normal appendix. Moderate fecal material in the proximal colon, decompressed distally. Vascular/Lymphatic: Moderate atheromatous calcifications of the aorta without aneurysm or stenosis. Portal vein patent. No abdominal or pelvic adenopathy. Reproductive: Status post hysterectomy. No adnexal masses. Other: No ascites.  No free air. Musculoskeletal: Chronic L2 compression deformity. Chronic mild L5 compression fracture deformity. Mild spondylitic changes in the lower lumbar spine. No acute fracture or worrisome bone lesion. IMPRESSION: 1. Acute appearing burst fracture of T6 vertebral body with 4 mm retropulsion into spinal canal. 2. No acute abdominal findings. 3. Chronic compression fracture deformities of L2 and L5 vertebral bodies. Electronically Signed   By: DLucrezia EuropeM.D.   On: 12/13/2017 09:22   Dg Foot Complete Left  Result Date: 12/13/2017 CLINICAL DATA:  Pt is having left foot pain, no known injury EXAM: LEFT FOOT - COMPLETE 3+ VIEW COMPARISON:  None. FINDINGS: Diffuse osteopenia which limits characterization of osseous detail, however, no fracture line or displaced fracture fragment identified and no acute or suspicious osseous lesion. No  osteophytes or other signs of advanced degenerative joint disease. No erosions or other signs of an inflammatory arthritis. Soft tissues about the left foot are unremarkable. IMPRESSION: 1. No  acute findings. 2. Osteopenia. Electronically Signed   By: Franki Cabot M.D.   On: 12/13/2017 14:56    Anti-infectives: Anti-infectives (From admission, onward)   None       Assessment/Plan H/o hemorrhagic stroke with L eye blindness Alzheimers - Aricept Multiple myeloma HTN - Cozaar DM - no treatment listed Hypothyroidism - synthroid  Fall down stairs L distal radius & ulnar styloid fx - per Dr. Caralyn Guile, nonop, splint ok to remove for hygiene, NWB, f/u 2 weeks R 5th base metacarpal fx - per Dr. Caralyn Guile, nonop, splint ok to remove for hygiene, NWB, f/u 2 weeks T6 burst fx - per Dr. Ronnald Ramp, nonop, not surgical candidate, TLSO, f/u at 3 months  ID - none FEN - regular diet, colace/miralax VTE - SCDs Foley - placed 3/10 Follow up - Dr. Caralyn Guile, NS of choice  Plan - Low UOP, give 500cc bolus of fluid and check CBC/BMP. Continue therapies. Will check with ortho on removable wrist splints. PT/OT recommending SNF.   LOS: 0 days    Wellington Hampshire , [facility] 12/15/2017, 8:08 AM Pager: [phone] Consults: [phone] Mon-Fri 7:00 am-4:30 pm Sat-Sun 7:00 am-11:30 am

## 2017-12-15 NOTE — Progress Notes (Signed)
Orthopedic Tech Progress Note Patient Details:  Judith Henderson July 27, 1930 [id number]  Ortho Devices Type of Ortho Device: Velcro wrist splint Ortho Device/Splint Location: (B) UE Ortho Device/Splint Interventions: Ordered   Post Interventions Patient Tolerated: Well Instructions Provided: Care of device   Braulio Bosch 12/15/2017, 4:09 PM

## 2017-12-16 DIAGNOSIS — S62306A Unspecified fracture of fifth metacarpal bone, right hand, initial encounter for closed fracture: Secondary | ICD-10-CM | POA: Diagnosis present

## 2017-12-16 DIAGNOSIS — Z6821 Body mass index (BMI) 21.0-21.9, adult: Secondary | ICD-10-CM | POA: Diagnosis not present

## 2017-12-16 DIAGNOSIS — E785 Hyperlipidemia, unspecified: Secondary | ICD-10-CM | POA: Diagnosis present

## 2017-12-16 DIAGNOSIS — G309 Alzheimer's disease, unspecified: Secondary | ICD-10-CM | POA: Diagnosis present

## 2017-12-16 DIAGNOSIS — Z79899 Other long term (current) drug therapy: Secondary | ICD-10-CM | POA: Diagnosis not present

## 2017-12-16 DIAGNOSIS — E119 Type 2 diabetes mellitus without complications: Secondary | ICD-10-CM | POA: Diagnosis present

## 2017-12-16 DIAGNOSIS — R339 Retention of urine, unspecified: Secondary | ICD-10-CM | POA: Diagnosis not present

## 2017-12-16 DIAGNOSIS — S52552A Other extraarticular fracture of lower end of left radius, initial encounter for closed fracture: Secondary | ICD-10-CM | POA: Diagnosis present

## 2017-12-16 DIAGNOSIS — Z880 Allergy status to penicillin: Secondary | ICD-10-CM | POA: Diagnosis not present

## 2017-12-16 DIAGNOSIS — E039 Hypothyroidism, unspecified: Secondary | ICD-10-CM | POA: Diagnosis present

## 2017-12-16 DIAGNOSIS — R64 Cachexia: Secondary | ICD-10-CM | POA: Diagnosis present

## 2017-12-16 DIAGNOSIS — Z23 Encounter for immunization: Secondary | ICD-10-CM | POA: Diagnosis present

## 2017-12-16 DIAGNOSIS — Z881 Allergy status to other antibiotic agents status: Secondary | ICD-10-CM | POA: Diagnosis not present

## 2017-12-16 DIAGNOSIS — W19XXXA Unspecified fall, initial encounter: Secondary | ICD-10-CM | POA: Diagnosis present

## 2017-12-16 DIAGNOSIS — F0280 Dementia in other diseases classified elsewhere without behavioral disturbance: Secondary | ICD-10-CM | POA: Diagnosis present

## 2017-12-16 DIAGNOSIS — I69398 Other sequelae of cerebral infarction: Secondary | ICD-10-CM | POA: Diagnosis not present

## 2017-12-16 DIAGNOSIS — W109XXA Fall (on) (from) unspecified stairs and steps, initial encounter: Secondary | ICD-10-CM | POA: Diagnosis not present

## 2017-12-16 DIAGNOSIS — S22051A Stable burst fracture of T5-T6 vertebra, initial encounter for closed fracture: Secondary | ICD-10-CM | POA: Diagnosis present

## 2017-12-16 DIAGNOSIS — H5462 Unqualified visual loss, left eye, normal vision right eye: Secondary | ICD-10-CM | POA: Diagnosis present

## 2017-12-16 DIAGNOSIS — S52615A Nondisplaced fracture of left ulna styloid process, initial encounter for closed fracture: Secondary | ICD-10-CM | POA: Diagnosis present

## 2017-12-16 DIAGNOSIS — Z79890 Hormone replacement therapy (postmenopausal): Secondary | ICD-10-CM | POA: Diagnosis not present

## 2017-12-16 DIAGNOSIS — K5900 Constipation, unspecified: Secondary | ICD-10-CM | POA: Diagnosis present

## 2017-12-16 DIAGNOSIS — I10 Essential (primary) hypertension: Secondary | ICD-10-CM | POA: Diagnosis present

## 2017-12-16 DIAGNOSIS — C9000 Multiple myeloma not having achieved remission: Secondary | ICD-10-CM | POA: Diagnosis present

## 2017-12-16 DIAGNOSIS — Z885 Allergy status to narcotic agent status: Secondary | ICD-10-CM | POA: Diagnosis not present

## 2017-12-16 LAB — BASIC METABOLIC PANEL
ANION GAP: 9 (ref 5–15)
BUN: 15 mg/dL (ref 6–20)
CALCIUM: 8.3 mg/dL — AB (ref 8.9–10.3)
CO2: 23 mmol/L (ref 22–32)
Chloride: 103 mmol/L (ref 101–111)
Creatinine, Ser: 0.69 mg/dL (ref 0.44–1.00)
GFR calc Af Amer: >60 mL/min (ref >60)
GFR calc non Af Amer: >60 mL/min (ref >60)
GLUCOSE: 113 mg/dL — AB (ref 65–99)
POTASSIUM: 3.4 mmol/L — AB (ref 3.5–5.1)
SODIUM: 135 mmol/L (ref 135–145)

## 2017-12-16 LAB — CBC
HCT: 30.6 % — ABNORMAL LOW (ref 36.0–46.0)
Hemoglobin: 9.9 g/dL — ABNORMAL LOW (ref 12.0–15.0)
MCH: 29.6 pg (ref 26.0–34.0)
MCHC: 32.4 g/dL (ref 30.0–36.0)
MCV: 91.6 fL (ref 78.0–100.0)
PLATELETS: 122 10*3/uL — AB (ref 150–400)
RBC: 3.34 MIL/uL — AB (ref 3.87–5.11)
RDW: 13.2 % (ref 11.5–15.5)
WBC: 7.4 10*3/uL (ref 4.0–10.5)

## 2017-12-16 MED ORDER — SODIUM CHLORIDE 0.9% FLUSH
10.0000 mL | INTRAVENOUS | Status: DC | PRN
  Administered 2017-12-16 – 2017-12-17 (×2): 10 mL
  Filled 2017-12-16 (×2): qty 40

## 2017-12-16 MED ORDER — TAB-A-VITE/IRON PO TABS: 1.0000 | ORAL_TABLET | Freq: Every day | ORAL | Status: DC

## 2017-12-16 MED ORDER — POTASSIUM CHLORIDE CRYS ER 20 MEQ PO TBCR
40.0000 meq | EXTENDED_RELEASE_TABLET | Freq: Two times a day (BID) | ORAL | Status: AC
  Administered 2017-12-16 (×2): 40 meq via ORAL
  Filled 2017-12-16 (×2): qty 2

## 2017-12-16 MED ORDER — BISACODYL 10 MG RE SUPP: 10.0000 mg | Freq: Once | RECTAL | Status: DC

## 2017-12-16 MED ORDER — ADULT MULTIVITAMIN W/MINERALS CH
1.0000 | ORAL_TABLET | Freq: Every day | ORAL | Status: DC
  Administered 2017-12-16 – 2017-12-17 (×2): 1 via ORAL
  Filled 2017-12-16 (×2): qty 1

## 2017-12-16 NOTE — Progress Notes (Signed)
Physical Therapy Treatment Patient Details Name: Judith Henderson MRN: [id number] DOB: Nov 24, 1929 Today's Date: 12/16/2017    History of Present Illness Pt is an 84 y.o. female presenting after falling down ~16 stairs. Pt found to have bil wrist fractures and T6 burst fracture; all being treated non-operatively. PMHx: CVA with residual L eye blindness, Alzheimers, multiple myelomia, HTN, DM.    PT Comments    Patient pleasant and agreeable to therapy. Pt required max A +2 for bed mobility and +2 to don TLSO sitting EOB due to poor sitting balance. Min A +2 for OOB transfer and pt able to take pivotal steps. Pt appeared comfortable end of session sitting up in recliner. Pt oriented to person only. Continue to progress as tolerated with anticipated d/c to SNF for further skilled PT services.     Follow Up Recommendations  SNF;Supervision/Assistance - 24 hour     Equipment Recommendations  Other (comment)(will determine at next venue of care)    Recommendations for Other Services OT consult     Precautions / Restrictions Precautions Precautions: Fall;Back Precaution Comments: Maintained back precautions throughout session Required Braces or Orthoses: Spinal Brace;Other Brace/Splint Spinal Brace: Thoracolumbosacral orthotic;Applied in sitting position Other Brace/Splint: bil wrist splints Restrictions Weight Bearing Restrictions: Yes RUE Weight Bearing: Non weight bearing LUE Weight Bearing: Non weight bearing    Mobility  Bed Mobility Overal bed mobility: Needs Assistance Bed Mobility: Supine to Sit     Supine to sit: Max assist;+2 for safety/equipment;HOB elevated     General bed mobility comments: assist for all aspects of bed mobility; pt assisted to bring bilat LE toward EOB with multimodal cues for sequencing  Transfers Overall transfer level: Needs assistance   Transfers: Sit to/from Stand;Stand Pivot Transfers Sit to Stand: Min assist;+2 physical assistance Stand  pivot transfers: Min assist;+2 physical assistance       General transfer comment: +2 face to face with use of gait belt to power up into standing and to maintain standing balance when pivoting; cues for sequencing; pt able to take pivotal steps EOB to recliner  Ambulation/Gait                 Stairs            Wheelchair Mobility    Modified Rankin (Stroke Patients Only)       Balance Overall balance assessment: Needs assistance;History of Falls Sitting-balance support: Feet supported(bilat elbows on pillows) Sitting balance-Leahy Scale: Poor Sitting balance - Comments: mod A initially and progressed to min A; +2 to don TLSO due to poor sitting balance Postural control: Posterior lean;Right lateral lean                                  Cognition Arousal/Alertness: Awake/alert Behavior During Therapy: WFL for tasks assessed/performed Overall Cognitive Status: No family/caregiver present to determine baseline cognitive functioning(history of cognitive impairments-not sure of baseline) Area of Impairment: Orientation;Memory;Following commands;Problem solving;Safety/judgement                 Orientation Level: Disoriented to;Place;Time;Situation   Memory: Decreased short-term memory;Decreased recall of precautions Following Commands: Follows one step commands inconsistently;Follows one step commands with increased time Safety/Judgement: Decreased awareness of deficits   Problem Solving: Decreased initiation;Difficulty sequencing;Requires verbal cues;Requires tactile cues General Comments: pt very pleasant and agreeable to therapy; word finding difficulties       Exercises      General Comments General comments (  skin integrity, edema, etc.): pt with difficulty turning head to L side       Pertinent Vitals/Pain Pain Assessment: Faces Faces Pain Scale: Hurts even more Pain Location: "all over" with mobility Pain Descriptors / Indicators:  Grimacing;Guarding;Moaning Pain Intervention(s): Limited activity within patient's tolerance;Monitored during session;Repositioned    Home Living                      Prior Function            PT Goals (current goals can now be found in the care plan section) Acute Rehab PT Goals PT Goal Formulation: With patient Time For Goal Achievement: 12/28/17 Potential to Achieve Goals: Fair Progress towards PT goals: Progressing toward goals    Frequency    Min 3X/week      PT Plan Current plan remains appropriate    Co-evaluation              AM-PAC PT "6 Clicks" Daily Activity  Outcome Measure  Difficulty turning over in bed (including adjusting bedclothes, sheets and blankets)?: Unable Difficulty moving from lying on back to sitting on the side of the bed? : Unable Difficulty sitting down on and standing up from a chair with arms (e.g., wheelchair, bedside commode, etc,.)?: Unable Help needed moving to and from a bed to chair (including a wheelchair)?: A Lot Help needed walking in hospital room?: A Lot Help needed climbing 3-5 steps with a railing? : Total 6 Click Score: 8    End of Session Equipment Utilized During Treatment: Back brace;Gait belt Activity Tolerance: Patient limited by pain Patient left: with call bell/phone within reach;in chair;with chair alarm set Nurse Communication: Mobility status PT Visit Diagnosis: Unsteadiness on feet (R26.81);Other abnormalities of gait and mobility (R26.89);History of falling (Z91.81);Muscle weakness (generalized) (M62.81);Difficulty in walking, not elsewhere classified (R26.2)     Time: 8338-2505 PT Time Calculation (min) (ACUTE ONLY): 27 min  Charges:  $Therapeutic Activity: 23-37 mins                    G Codes:       Earney Navy, PTA Pager: [phone]     Darliss Cheney 12/16/2017, 10:22 AM

## 2017-12-16 NOTE — Care Management Note (Signed)
Case Management Note  Patient Details  Name: Jiali Linney MRN: [id number] Date of Birth: 1930-10-02  Subjective/Objective:  Pt is an 84 y.o. female presenting after falling down ~16 stairs. Pt found to have bil wrist fractures and T6 burst fracture; all being treated non-operatively.  PTA, pt resides at home with husband and nephew.                     Action/Plan: PT/OT recommending SNF at discharge.  CSW following to facilitate dc to SNF upon medical stability.    Expected Discharge Date:                  Expected Discharge Plan:  Skilled Nursing Facility  In-House Referral:  Clinical Social Work  Discharge planning Services  CM Consult  Post Acute Care Choice:    Choice offered to:     DME Arranged:    DME Agency:     HH Arranged:    [place] Agency:     Status of Service:  In process, will continue to follow  If discussed at Long Length of Stay Meetings, dates discussed:    Additional Comments:  Reinaldo Raddle, RN, BSN  Trauma/Neuro ICU Case Manager [phone]

## 2017-12-16 NOTE — Progress Notes (Signed)
Orthopedic Tech Progress Note Patient Details:  Judith Henderson 02-22-1930 [id number]  Ortho Devices Type of Ortho Device: Velcro wrist splint Ortho Device/Splint Location: bilateral Ortho Device/Splint Interventions: Application   Post Interventions Patient Tolerated: Well Instructions Provided: Care of device  Removed existing bilateral splints and placed velcro bilateral wrist splints as ordered by Dr. Barnabas Harries, Caylyn Tedeschi 12/16/2017, 2:04 PM

## 2017-12-16 NOTE — Plan of Care (Signed)
  Progressing Education: Knowledge of General Education information will improve 12/16/2017 1546 - Progressing by Rance Muir, RN Health Behavior/Discharge Planning: Ability to manage health-related needs will improve 12/16/2017 1546 - Progressing by Rance Muir, RN Clinical Measurements: Ability to maintain clinical measurements within normal limits will improve 12/16/2017 1546 - Progressing by Rance Muir, RN Will remain free from infection 12/16/2017 1546 - Progressing by Rance Muir, RN Diagnostic test results will improve 12/16/2017 1546 - Progressing by Rance Muir, RN Respiratory complications will improve 12/16/2017 1546 - Progressing by Rance Muir, RN Cardiovascular complication will be avoided 12/16/2017 1546 - Progressing by Rance Muir, RN Activity: Risk for activity intolerance will decrease 12/16/2017 1546 - Progressing by Rance Muir, RN Nutrition: Adequate nutrition will be maintained 12/16/2017 1546 - Progressing by Rance Muir, RN Coping: Level of anxiety will decrease 12/16/2017 1546 - Progressing by Rance Muir, RN Elimination: Will not experience complications related to bowel motility 12/16/2017 1546 - Progressing by Rance Muir, RN Will not experience complications related to urinary retention 12/16/2017 1546 - Progressing by Rance Muir, RN Pain Managment: General experience of comfort will improve 12/16/2017 1546 - Progressing by Rance Muir, RN Safety: Ability to remain free from injury will improve 12/16/2017 1546 - Progressing by Rance Muir, RN Skin Integrity: Risk for impaired skin integrity will decrease 12/16/2017 1546 - Progressing by Rance Muir, RN

## 2017-12-16 NOTE — Progress Notes (Signed)
[facility] Progress Note     Subjective: CC-  Patient sitting up being fed breakfast. She has no complaints. Denies any pain, but she does still moan when she is moved around in bed. Tolerating her breakfast. No BM since admission.  Objective: Vital signs in last 24 hours: Temp:  [98.9 F (37.2 C)-99 F (37.2 C)] 99 F (37.2 C) (03/11 2023) Pulse Rate:  [79-85] 80 (03/12 0629) Resp:  [14-16] 14 (03/11 2023) BP: (129-200)/(69-80) 150/71 (03/12 0629) SpO2:  [93 %-96 %] 93 % (03/12 0629) Last BM Date: (PTA)  Intake/Output from previous day: 03/11 0701 - 03/12 0700 In: 155 [P.O.:145; I.V.:10] Out: 1000 [Urine:1000] Intake/Output this shift: No intake/output data recorded.  PE: Gen:  Alert, NAD HEENT: EOM's intact, pupils equal and round Card:  RRR, no M/G/R heard Pulm:  CTAB, no W/R/R, effort normal Abd: Soft, mild distension, NT, +BS, no HSM, no hernia BUE: Splints in place BUE, fingers WWP, able to wiggle fingers BLE: No calf edema or tenderness. Able to wiggle toes  Lab Results:  Recent Labs    12/15/17 0820 12/16/17 0403  WBC 8.2 7.4  HGB 10.3* 9.9*  HCT 31.6* 30.6*  PLT 111* 122*   BMET Recent Labs    12/15/17 0820 12/16/17 0403  NA 137 135  K 4.0 3.4*  CL 106 103  CO2 23 23  GLUCOSE 103* 113*  BUN 19 15  CREATININE 0.72 0.69  CALCIUM 8.6* 8.3*   PT/INR No results for input(s): LABPROT, INR in the last 72 hours. CMP     Component Value Date/Time   NA 135 12/16/2017 0403   K 3.4 (L) 12/16/2017 0403   CL 103 12/16/2017 0403   CO2 23 12/16/2017 0403   GLUCOSE 113 (H) 12/16/2017 0403   BUN 15 12/16/2017 0403   CREATININE 0.69 12/16/2017 0403   CALCIUM 8.3 (L) 12/16/2017 0403   PROT 5.9 (L) 09/09/2016 0557   ALBUMIN 3.3 (L) 09/09/2016 0557   AST 20 09/09/2016 0557   ALT 13 (L) 09/09/2016 0557   ALKPHOS 34 (L) 09/09/2016 0557   BILITOT 0.9 09/09/2016 0557   GFRNONAA >60 12/16/2017 0403   GFRAA >60 12/16/2017 0403   Lipase     Component Value Date/Time   LIPASE 99 (H) 12/13/2017 0620       Studies/Results: Dg Thoracic Spine 2 View  Result Date: 12/14/2017 CLINICAL DATA:  Thoracic compression fracture EXAM: THORACIC SPINE 2 VIEWS COMPARISON:  CT 12/13/2017 FINDINGS: Severe compression fracture noted in the midthoracic spine, likely T6 level. Kyphotic deformity centered at this level. No subluxation. No visible retropulsed fracture fragments. IMPRESSION: Severe compression deformity at T6. Electronically Signed   By: Rolm Baptise M.D.   On: 12/14/2017 10:01    Anti-infectives: Anti-infectives (From admission, onward)   None       Assessment/Plan H/o hemorrhagic stroke with L eye blindness Alzheimers - Aricept Multiple myeloma HTN - Cozaar DM - no treatment listed Hypothyroidism - synthroid  Fall down stairs L distal radius & ulnar styloid fx - per Dr. Caralyn Guile, nonop, splint ok to remove for hygiene, NWB, f/u 2 weeks R 5th base metacarpal fx - per Dr. Caralyn Guile, nonop, splint ok to remove for hygiene, NWB, f/u 2 weeks T6 burst fx - per Dr. Ronnald Ramp, nonop, not surgical candidate, TLSO, f/u at 3 months Urinary retention - foley placed 3/10, UOP improved yesterday, voiding trial today. Check u/a  ID - none FEN - regular diet, colace/miralax, dulcolax suppository today VTE -  SCDs Foley - voiding trial today Follow up - Dr. Caralyn Guile, NS of choice  Plan - Replace potassium. Wean supplemental O2 as tolerated. Voiding trial. I will check on wrist splints again today. BMP in AM.   LOS: 0 days    Wellington Hampshire , [facility] 12/16/2017, 7:56 AM Pager: [phone] Consults: [phone] Mon-Fri 7:00 am-4:30 pm Sat-Sun 7:00 am-11:30 am

## 2017-12-16 NOTE — Clinical Social Work Note (Signed)
Clinical Social Worker continuing to follow patient and family for support and discharge planning needs.  Patient currently with no bed offers.  CSW spoke with patient nephew who is next of kin and he provided additional facilities to send referral.  CSW initiated referral and will follow up regarding available bed offers.  Patient family reasonable and agreeable with patient discharge to SNF once medically stable.  Barbette Or, [place]

## 2017-12-17 LAB — BASIC METABOLIC PANEL
ANION GAP: 6 (ref 5–15)
BUN: 13 mg/dL (ref 6–20)
CHLORIDE: 107 mmol/L (ref 101–111)
CO2: 23 mmol/L (ref 22–32)
Calcium: 8.4 mg/dL — ABNORMAL LOW (ref 8.9–10.3)
Creatinine, Ser: 0.65 mg/dL (ref 0.44–1.00)
GFR calc Af Amer: >60 mL/min (ref >60)
Glucose, Bld: 119 mg/dL — ABNORMAL HIGH (ref 65–99)
POTASSIUM: 4.4 mmol/L (ref 3.5–5.1)
SODIUM: 136 mmol/L (ref 135–145)

## 2017-12-17 MED ORDER — DOCUSATE SODIUM 100 MG PO CAPS: 100.0000 mg | ORAL_CAPSULE | Freq: Two times a day (BID) | ORAL | 0 refills | Status: AC

## 2017-12-17 MED ORDER — POLYETHYLENE GLYCOL 3350 17 G PO PACK: 17.0000 g | PACK | Freq: Two times a day (BID) | ORAL | 0 refills | Status: AC

## 2017-12-17 MED ORDER — ACETAMINOPHEN 325 MG PO TABS: 650.0000 mg | ORAL_TABLET | Freq: Four times a day (QID) | ORAL | Status: AC | PRN

## 2017-12-17 MED ORDER — TRAMADOL HCL 50 MG PO TABS: 50.0000 mg | ORAL_TABLET | Freq: Four times a day (QID) | ORAL | 0 refills | Status: AC | PRN

## 2017-12-17 MED ORDER — ADULT MULTIVITAMIN W/MINERALS CH: 1.0000 | ORAL_TABLET | Freq: Every day | ORAL | Status: AC

## 2017-12-17 MED ORDER — BISACODYL 10 MG RE SUPP: 10.0000 mg | Freq: Once | RECTAL | Status: DC

## 2017-12-17 NOTE — Progress Notes (Signed)
Occupational Therapy Treatment Patient Details Name: Judith Henderson MRN: [id number] DOB: August 19, 1930 Today's Date: 12/17/2017    History of present illness Pt is an 84 y.o. female presenting after falling down ~16 stairs. Pt found to have bil wrist fractures and T6 burst fracture; all being treated non-operatively. PMHx: CVA with residual L eye blindness, Alzheimers, multiple myelomia, HTN, DM.   OT comments  Pt progressing towards OT goals, completing bed mobility with maxA+2 and able to complete stand pivot transfers EOB>BSC>recliner using bil platform RW with MinA+2. Pt continues to require max-total assist for ADL completion secondary to bil wrist pain and functional limitations. Feel SNF recommendation remains appropriate at this time. Will continue to follow acutely to progress pt towards established OT goals.   Follow Up Recommendations  SNF;Supervision/Assistance - 24 hour    Equipment Recommendations  Other (comment)(TBD in next venue )          Precautions / Restrictions Precautions Precautions: Fall;Back Precaution Comments: Maintained back precautions throughout session Required Braces or Orthoses: Spinal Brace;Other Brace/Splint Spinal Brace: Thoracolumbosacral orthotic;Applied in sitting position Other Brace/Splint: bil wrist splints Restrictions Weight Bearing Restrictions: Yes RUE Weight Bearing: Non weight bearing LUE Weight Bearing: Non weight bearing       Mobility Bed Mobility Overal bed mobility: Needs Assistance Bed Mobility: Rolling;Sidelying to Sit Rolling: Max assist;+2 for safety/equipment Sidelying to sit: Max assist;+2 for safety/equipment;+2 for physical assistance       General bed mobility comments: pt continues to require assist and multimodal cues for all aspects of bed mobility; assisted with log roll technique including bridging and bringing LEs over EOB, bringing trunk into upright sitting position   Transfers Overall transfer level: Needs  assistance Equipment used: Bilateral platform walker Transfers: Sit to/from Stand;Stand Pivot Transfers Sit to Stand: +2 physical assistance;Mod assist;Min assist Stand pivot transfers: Min assist;+2 physical assistance       General transfer comment: assist to power up into standing, gain balance, and for placement of bilat UE on bilat platform RW; pt needs hand over hand assist for placement of bilat UE     Balance Overall balance assessment: Needs assistance;History of Falls Sitting-balance support: Feet supported Sitting balance-Leahy Scale: Fair Sitting balance - Comments: pt progressed to min guard assist when donning TLSO EOB Postural control: Posterior lean;Right lateral lean Standing balance support: Bilateral upper extremity supported;During functional activity Standing balance-Leahy Scale: Poor                             ADL either performed or assessed with clinical judgement   ADL Overall ADL's : Needs assistance/impaired     Grooming: Maximal assistance;Sitting;Wash/dry face                   Toilet Transfer: Minimal assistance;+2 for physical assistance;+2 for safety/equipment;Stand-pivot;RW;BSC           Functional mobility during ADLs: Minimal assistance;+2 for physical assistance;+2 for safety/equipment;Maximal assistance;Rolling walker(MaxA bed mobility; MinA+2 stand pivot transfers using platform RW ) General ADL Comments: Pt continues to require max-total assist for ADL completion secondary to bil UE wrist impairments; completed stand pivot transfer to [facility] this session with MinA+2 using bil platform RW                        Cognition Arousal/Alertness: Awake/alert Behavior During Therapy: WFL for tasks assessed/performed Overall Cognitive Status: No family/caregiver present to determine baseline cognitive functioning Area of Impairment:  Memory;Following commands;Problem solving;Safety/judgement                  Orientation Level: Disoriented to;Place;Time;Situation   Memory: Decreased short-term memory;Decreased recall of precautions Following Commands: Follows one step commands inconsistently;Follows one step commands with increased time Safety/Judgement: Decreased awareness of deficits   Problem Solving: Decreased initiation;Difficulty sequencing;Requires verbal cues;Requires tactile cues;Slow processing General Comments: pt very pleasant and agreeable to therapy; word finding difficulties; requires multimodal cues and increased time/repetition to follow one-step commands, requires simple, concise commands         Exercises General Exercises - Lower Extremity Ankle Circles/Pumps: AROM;Left;Right;Supine;5 reps Straight Leg Raises: AAROM;Right;Left;Supine;10 reps Other Exercises Other Exercises: bil UE shoulder flexion AAROM approx 0-45*, elbow flexion/extension for 3-5 reps each motion          General Comments      Pertinent Vitals/ Pain       Pain Assessment: Faces Faces Pain Scale: Hurts whole lot Pain Location: "all over" with mobility Pain Descriptors / Indicators: Grimacing;Guarding;Moaning Pain Intervention(s): Limited activity within patient's tolerance;Monitored during session;Repositioned                                                          Frequency  Min 2X/week        Progress Toward Goals  OT Goals(current goals can now be found in the care plan section)  Progress towards OT goals: Progressing toward goals  Acute Rehab OT Goals Patient Stated Goal: decrease pain OT Goal Formulation: With patient Time For Goal Achievement: 12/28/17 Potential to Achieve Goals: Good  Plan Discharge plan remains appropriate    Co-evaluation    PT/OT/SLP Co-Evaluation/Treatment: Yes Reason for Co-Treatment: For patient/therapist safety;To address functional/ADL transfers PT goals addressed during session: Mobility/safety with  mobility;Balance;Proper use of DME OT goals addressed during session: Strengthening/ROM;Proper use of Adaptive equipment and DME      AM-PAC PT "6 Clicks" Daily Activity     Outcome Measure   Help from another person eating meals?: Total Help from another person taking care of personal grooming?: A Lot Help from another person toileting, which includes using toliet, bedpan, or urinal?: Total Help from another person bathing (including washing, rinsing, drying)?: Total Help from another person to put on and taking off regular upper body clothing?: Total Help from another person to put on and taking off regular lower body clothing?: Total 6 Click Score: 7    End of Session Equipment Utilized During Treatment: Gait belt;Back brace;Rolling walker(platform RW )  OT Visit Diagnosis: Other abnormalities of gait and mobility (R26.89);Unsteadiness on feet (R26.81);History of falling (Z91.81);Muscle weakness (generalized) (M62.81);Pain Pain - part of body: Arm(back)   Activity Tolerance Patient tolerated treatment well   Patient Left in bed;with call bell/phone within reach;with bed alarm set;with SCD's reapplied   Nurse Communication Mobility status;Patient requests pain meds;Weight bearing status;Precautions        Time: 3664-4034 OT Time Calculation (min): 45 min  Charges: OT General Charges $OT Visit: 1 Visit OT Treatments $Self Care/Home Management : 8-22 mins $Therapeutic Activity: 8-22 mins  Lou Cal, [place] Pager [phone] 12/17/2017    Raymondo Band 12/17/2017, 4:42 PM

## 2017-12-17 NOTE — Progress Notes (Signed)
Patient ID: Judith Henderson, female   DOB: 1929/11/17, 84 y.o.   MRN: [id number] Awake alert, OOB in chair, wiggle toes, therapy notes reviewed

## 2017-12-17 NOTE — Progress Notes (Signed)
Physical Therapy Treatment Patient Details Name: Judith Henderson MRN: [id number] DOB: Feb 14, 1930 Today's Date: 12/17/2017    History of Present Illness Pt is an 84 y.o. female presenting after falling down ~16 stairs. Pt found to have bil wrist fractures and T6 burst fracture; all being treated non-operatively. PMHx: CVA with residual L eye blindness, Alzheimers, multiple myelomia, HTN, DM.    PT Comments    Patient is making gradual progress toward PT goals. Pt continues to have the most difficulty with bed mobility but does assist as able. Pt tolerated short distance gait with bilat platform RW with +2 assist for safety. Pt followed simple commands more consistently than previous session. Continue to progress as tolerated with anticipated d/c to SNF for further skilled PT services.     Follow Up Recommendations  SNF;Supervision/Assistance - 24 hour     Equipment Recommendations  Other (comment)(TBD next venue)    Recommendations for Other Services OT consult     Precautions / Restrictions Precautions Precautions: Fall;Back Precaution Comments: Maintained back precautions throughout session Required Braces or Orthoses: Spinal Brace;Other Brace/Splint Spinal Brace: Thoracolumbosacral orthotic;Applied in sitting position Other Brace/Splint: bil wrist splints Restrictions Weight Bearing Restrictions: Yes RUE Weight Bearing: Non weight bearing LUE Weight Bearing: Non weight bearing    Mobility  Bed Mobility Overal bed mobility: Needs Assistance Bed Mobility: Rolling;Sidelying to Sit Rolling: Max assist;+2 for safety/equipment Sidelying to sit: Max assist;+2 for safety/equipment;+2 for physical assistance       General bed mobility comments: pt continues to require assist and multimodal cues for all aspects of bed mobility; assisted with log roll technique including bridging and bringing LEs over EOB, bringing trunk into upright sitting position   Transfers Overall transfer  level: Needs assistance Equipment used: Bilateral platform walker Transfers: Sit to/from Stand;Stand Pivot Transfers Sit to Stand: +2 physical assistance;Mod assist;Min assist Stand pivot transfers: Min assist;+2 physical assistance       General transfer comment: assist to power up into standing, gain balance, and for placement of bilat UE on bilat platform RW; pt needs hand over hand assist for placement of bilat UE   Ambulation/Gait Ambulation/Gait assistance: Min assist;+2 physical assistance Ambulation Distance (Feet): 3 Feet Assistive device: (bilat platform RW) Gait Pattern/deviations: Step-through pattern;Decreased step length - right;Decreased step length - left     General Gait Details: assit for balance and for managing RW; cues for posture and sequencing   Stairs            Wheelchair Mobility    Modified Rankin (Stroke Patients Only)       Balance Overall balance assessment: Needs assistance;History of Falls Sitting-balance support: Feet supported Sitting balance-Leahy Scale: Fair Sitting balance - Comments: pt progressed to min guard assist when donning TLSO EOB   Standing balance support: Bilateral upper extremity supported;During functional activity Standing balance-Leahy Scale: Poor                              Cognition Arousal/Alertness: Awake/alert Behavior During Therapy: WFL for tasks assessed/performed Overall Cognitive Status: No family/caregiver present to determine baseline cognitive functioning Area of Impairment: Memory;Following commands;Problem solving;Safety/judgement                 Orientation Level: Disoriented to;Place;Time;Situation   Memory: Decreased short-term memory;Decreased recall of precautions Following Commands: Follows one step commands inconsistently;Follows one step commands with increased time Safety/Judgement: Decreased awareness of deficits   Problem Solving: Decreased initiation;Difficulty  sequencing;Requires verbal cues;Requires  tactile cues;Slow processing General Comments: pt very pleasant and agreeable to therapy; word finding difficulties; requires multimodal cues and increased time/repetition to follow one-step commands, requires simple, concise commands       Exercises      General Comments        Pertinent Vitals/Pain Pain Assessment: Faces Faces Pain Scale: Hurts whole lot Pain Location: "all over" with mobility Pain Descriptors / Indicators: Grimacing;Guarding;Moaning Pain Intervention(s): Limited activity within patient's tolerance;Monitored during session;Repositioned    Home Living                      Prior Function            PT Goals (current goals can now be found in the care plan section) Progress towards PT goals: Progressing toward goals    Frequency    Min 3X/week      PT Plan Current plan remains appropriate    Co-evaluation PT/OT/SLP Co-Evaluation/Treatment: Yes            AM-PAC PT "6 Clicks" Daily Activity  Outcome Measure  Difficulty turning over in bed (including adjusting bedclothes, sheets and blankets)?: Unable Difficulty moving from lying on back to sitting on the side of the bed? : Unable Difficulty sitting down on and standing up from a chair with arms (e.g., wheelchair, bedside commode, etc,.)?: Unable Help needed moving to and from a bed to chair (including a wheelchair)?: A Lot Help needed walking in hospital room?: A Little Help needed climbing 3-5 steps with a railing? : A Lot 6 Click Score: 10    End of Session Equipment Utilized During Treatment: Back brace;Gait belt Activity Tolerance: Patient tolerated treatment well Patient left: in chair;with call bell/phone within reach;with chair alarm set Nurse Communication: Mobility status PT Visit Diagnosis: Unsteadiness on feet (R26.81);Other abnormalities of gait and mobility (R26.89);History of falling (Z91.81);Muscle weakness (generalized)  (M62.81);Difficulty in walking, not elsewhere classified (R26.2)     Time: 4132-4401 PT Time Calculation (min) (ACUTE ONLY): 45 min  Charges:  $Therapeutic Activity: 8-22 mins                    G Codes:       Earney Navy, PTA Pager: [phone]     Darliss Cheney 12/17/2017, 4:09 PM

## 2017-12-17 NOTE — Discharge Instructions (Signed)
Vertebral Fracture A vertebral fracture means that one of the bones in the spine is broken (fractured). These bones are called vertebrae. You may have back pain that gets worse when you move. Vertebral fractures can be mild or severe. Many will get better without surgery. Surgery may be needed for more severe breaks. Follow these instructions at home: General instructions  Take medicines only as told by your doctor.  Do not drive or use heavy machinery while taking pain medicine.  If told, put ice on the injured area: ? Put ice in a plastic bag. ? Place a towel between your skin and the bag. ? Leave the ice on for 30 minutes every 2 hours at first. Then use the ice as needed.  Wear your neck brace or back brace as told by your doctor.  Do not drink alcohol.  Keep all follow-up visits as told by your doctor. This is important. Activity  Stay in bed (on bed rest) only as told by your doctor. Being on bed rest for too long can make your condition worse.  Return to your normal activities as told by your doctor. Ask your doctor what is safe for you to do.  Do exercises for your back (physical therapy) as told by your doctor.  Exercise often as told by your doctor. Contact a doctor if:  You have a fever.  You have a cough that makes your pain worse.  Your pain medicine is not helping.  Your pain does not get better over time.  You cannot return to your normal activities as planned. Get help right away if:  Your pain is very bad and it suddenly gets worse.  You are not able to move any body part (paralysis) that is below the level of your injury.  You have numbness, tingling, or weakness in any body part that is below the level of your injury.  You cannot control when you pee (urinate) or when you poop (have bowel movements). This information is not intended to replace advice given to you by your health care provider. Make sure you discuss any questions you have with your  health care provider. Document Released: 03/13/2010 Document Revised: 02/29/2016 Document Reviewed: 09/28/2014 Elsevier Interactive Patient Education  2018 [employer]vier Inc[employer]  

## 2017-12-17 NOTE — Clinical Social Work Placement (Signed)
   CLINICAL SOCIAL WORK PLACEMENT  NOTE  Date:  12/17/2017  Patient Details  Name: Naimah Yingst MRN: [id number] Date of Birth: 01-Aug-1930  Clinical Social Work is seeking post-discharge placement for this patient at the [place] level of care (*CSW will initial, date and re-position this form in  chart as items are completed):      Patient/family provided with [place] Work Department's list of facilities offering this level of care within the geographic area requested by the patient (or if unable, by the patient's family).  Yes   Patient/family informed of their freedom to choose among providers that offer the needed level of care, that participate in Medicare, Medicaid or managed care program needed by the patient, have an available bed and are willing to accept the patient.      Patient/family informed of [place]'s ownership interest in [facility] and [facility], as well as of the fact that they are under no obligation to receive care at these facilities.  PASRR submitted to EDS on       PASRR number received on       Existing PASRR number confirmed on 12/14/17     FL2 transmitted to all facilities in geographic area requested by pt/family on 12/14/17     FL2 transmitted to all facilities within larger geographic area on       Patient informed that his/her managed care company has contracts with or will negotiate with certain facilities, including the following:        Yes   Patient/family informed of bed offers received.  Patient chooses bed at Other - please specify in the comment section below:([facility])     Physician recommends and patient chooses bed at      Patient to be transferred to Other - please specify in the comment section below:([facility]) on 12/17/17.  Patient to be transferred to facility by Ambulance     Patient family notified on 12/17/17 of transfer.  Name of family member notified:   Patient nephew and spouse at bedside     PHYSICIAN Please prepare priority discharge summary, including medications     Additional Comment:   Barbette Or, [place]

## 2017-12-17 NOTE — Care Management Note (Signed)
Case Management Note  Patient Details  Name: Judith Henderson MRN: [id number] Date of Birth: Sep 07, 1930  Subjective/Objective:  Pt is an 84 y.o. female presenting after falling down ~16 stairs. Pt found to have bil wrist fractures and T6 burst fracture; all being treated non-operatively.  PTA, pt resides at home with husband and nephew.                     Action/Plan: PT/OT recommending SNF at discharge.  CSW following to facilitate dc to SNF upon medical stability.    Expected Discharge Date:  12/17/17               Expected Discharge Plan:  Skilled Nursing Facility  In-House Referral:  Clinical Social Work  Discharge planning Services  CM Consult  Post Acute Care Choice:    Choice offered to:     DME Arranged:    DME Agency:     HH Arranged:    [place] Agency:     Status of Service:  Completed, signed off  If discussed at [employer], dates discussed:    Additional Comments:  12/17/17 J. Helana Macbride, RN, BSN Pt medically stable for dc today.  Plan dc to SNF, per CSW arrangements.    Reinaldo Raddle, RN, BSN  Trauma/Neuro ICU Case Manager [phone]

## 2017-12-17 NOTE — Plan of Care (Signed)
  Progressing Education: Knowledge of General Education information will improve 12/17/2017 0800 - Progressing by Rance Muir, RN Health Behavior/Discharge Planning: Ability to manage health-related needs will improve 12/17/2017 0800 - Progressing by Rance Muir, RN Clinical Measurements: Ability to maintain clinical measurements within normal limits will improve 12/17/2017 0800 - Progressing by Rance Muir, RN Will remain free from infection 12/17/2017 0800 - Progressing by Rance Muir, RN Diagnostic test results will improve 12/17/2017 0800 - Progressing by Rance Muir, RN Respiratory complications will improve 12/17/2017 0800 - Progressing by Rance Muir, RN Cardiovascular complication will be avoided 12/17/2017 0800 - Progressing by Rance Muir, RN Activity: Risk for activity intolerance will decrease 12/17/2017 0800 - Progressing by Rance Muir, RN Nutrition: Adequate nutrition will be maintained 12/17/2017 0800 - Progressing by Rance Muir, RN Coping: Level of anxiety will decrease 12/17/2017 0800 - Progressing by Rance Muir, RN Elimination: Will not experience complications related to bowel motility 12/17/2017 0800 - Progressing by Rance Muir, RN Will not experience complications related to urinary retention 12/17/2017 0800 - Progressing by Rance Muir, RN Pain Managment: General experience of comfort will improve 12/17/2017 0800 - Progressing by Rance Muir, RN Safety: Ability to remain free from injury will improve 12/17/2017 0800 - Progressing by Rance Muir, RN Skin Integrity: Risk for impaired skin integrity will decrease 12/17/2017 0800 - Progressing by Rance Muir, RN

## 2017-12-17 NOTE — Progress Notes (Addendum)
[facility] Progress Note     Subjective: CC-  Patient resting this morning. States that she hurts all over, but cannot specify what hurts. Foley removed yesterday and she is having no issues with voiding. Tolerating small amounts of her diet. She still has not had a BM. Worked well with PT yesterday. She required max assist but was able to sit on the edge of the bed and take pivotal steps.  Objective: Vital signs in last 24 hours: Temp:  [97.6 F (36.4 C)-98.5 F (36.9 C)] 97.6 F (36.4 C) (03/13 0559) Pulse Rate:  [70-88] 70 (03/13 0559) Resp:  [18-21] 19 (03/13 0559) BP: (138-181)/(66-83) 144/71 (03/13 0559) SpO2:  [93 %-98 %] 93 % (03/13 0559) Last BM Date: (PTA)  Intake/Output from previous day: 03/12 0701 - 03/13 0700 In: 610 [P.O.:600; I.V.:10] Out: 1000 [Urine:1000] Intake/Output this shift: No intake/output data recorded.  PE: Gen: Alert, NAD HEENT: EOM's intact, pupils equal and round, on 2L [place] Card: RRR, no M/G/R heard Pulm: CTAB, no W/R/R, effort normal Abd: Soft,mild distension, NT, +BS, no HSM, no hernia BUE: removable splints in place BUE, fingers WWP, able to wiggle fingers BLE: motor and sensory function intact  Lab Results:  Recent Labs    12/15/17 0820 12/16/17 0403  WBC 8.2 7.4  HGB 10.3* 9.9*  HCT 31.6* 30.6*  PLT 111* 122*   BMET Recent Labs    12/16/17 0403 12/17/17 0444  NA 135 136  K 3.4* 4.4  CL 103 107  CO2 23 23  GLUCOSE 113* 119*  BUN 15 13  CREATININE 0.69 0.65  CALCIUM 8.3* 8.4*   PT/INR No results for input(s): LABPROT, INR in the last 72 hours. CMP     Component Value Date/Time   NA 136 12/17/2017 0444   K 4.4 12/17/2017 0444   CL 107 12/17/2017 0444   CO2 23 12/17/2017 0444   GLUCOSE 119 (H) 12/17/2017 0444   BUN 13 12/17/2017 0444   CREATININE 0.65 12/17/2017 0444   CALCIUM 8.4 (L) 12/17/2017 0444   PROT 5.9 (L) 09/09/2016 0557   ALBUMIN 3.3 (L) 09/09/2016 0557   AST 20 09/09/2016 0557   ALT 13 (L) 09/09/2016 0557   ALKPHOS 34 (L) 09/09/2016 0557   BILITOT 0.9 09/09/2016 0557   GFRNONAA >60 12/17/2017 0444   GFRAA >60 12/17/2017 0444   Lipase     Component Value Date/Time   LIPASE 99 (H) 12/13/2017 0620       Studies/Results: No results found.  Anti-infectives: Anti-infectives (From admission, onward)   None       Assessment/Plan H/o hemorrhagic stroke with L eye blindness Alzheimers - Aricept Multiple myeloma HTN - Cozaar DM - no treatment listed Hypothyroidism - synthroid  Fall down stairs L distal radius & ulnar styloid fx- per Dr. Caralyn Guile, nonop, splint ok to remove for hygiene, NWB, f/u 2 weeks R 5th base metacarpal fx- per Dr. Caralyn Guile, nonop, splint ok to remove for hygiene, NWB, f/u 2 weeks T6 burst fx- per Dr. Ronnald Ramp, nonop, not surgical candidate, TLSO, f/u at 3 months Urinary retention - resolved  ID -none FEN -regular diet, colace/miralax, dulcolax suppository VTE -SCDs Foley -out 3/12 Follow up -Dr. Caralyn Guile, NS of choice  Plan- U/a pending. Continue to wean supplemental O2 as tolerated. Continue therapies. Patient stable for d/c to SNF when bed available.    LOS: 1 day    Wellington Hampshire , [facility] 12/17/2017, 7:39 AM Pager: [phone] Consults: [phone] Mon-Fri  7:00 am-4:30 pm Sat-Sun 7:00 am-11:30 am

## 2017-12-17 NOTE — Clinical Social Work Note (Signed)
Clinical Social Worker facilitated patient discharge including contacting patient family and facility to confirm patient discharge plans.  Clinical information faxed to facility and family agreeable with plan.  CSW arranged ambulance transport via [facility] to [facility].  RN to call report prior to discharge.  Clinical Social Worker will sign off for now as social work intervention is no longer needed. Please consult [place] again if new need arises.  Barbette Or, [place]

## 2017-12-17 NOTE — Progress Notes (Signed)
OT Cancellation Note  Patient Details Name: Judith Henderson MRN: [id number] DOB: 1929-10-27   Cancelled Treatment:    Reason Eval/Treat Not Completed: Other (comment); spoke with RN, pt currently sleeping soundly and requesting to let pt sleep while she is able. Will follow up later today as schedule allows.   Lou Cal, OT Pager [phone] 12/17/2017   Raymondo Band 12/17/2017, 10:03 AM

## 2017-12-17 NOTE — Discharge Summary (Signed)
[facility] Discharge Summary   Patient ID: Judith Henderson MRN: [id number] DOB/AGE: [birth year] 82 y.o.  Admit date: 12/13/2017 Discharge date: 12/17/2017  Admitting Diagnosis: fall down stairs Left distal radius & ulnar styloid fx right 5th metacarpal fx Acute t6 burst fx   Discharge Diagnosis Patient Active Problem List   Diagnosis Date Noted  . Fall 12/16/2017  . Thoracic spine fracture ([place]) 12/13/2017  . Hyperlipidemia 12/12/2016  . Hemorrhagic stroke ([place])   . Diastolic dysfunction   . Diabetes mellitus type 2 in nonobese (HCC)   . Multiple myeloma ([place])   . Benign essential HTN   . Tachycardia   . Hypothyroidism   . Hypokalemia   . Hypoalbuminemia due to protein-calorie malnutrition ([place])   . Acute blood loss anemia   . Essential hypertension   . ICH (intracerebral hemorrhage) ([place]) 09/07/2016    Consultants Orthopedics  Neurosurgery  Imaging: No results found.  Procedures None  Hospital Course:  Jaeden Westbay is an yo 84 female PMH Alzheimers and multiple myeloma, who presented to [facility] 3/9 after suffering a fall at home. Patient fell down about 16 stairs into her basement early this AM, shortly before ER arrival which was around 0600. Does not recall event. Brought to ER where trauma scans and plain films show bilateral wrist fx and T6 burst fx with 28m retropulsion, no other injuries. C/o back pain and wrist pain. Patient was admitted to trauma service for pain control and observation. Neurosurgery consulted for spine injury and recommended nonoperative management with TLSO brace. Orthopedics consulted for bilateral hand/wrist fractures and also recommended nonoperative management with removable short arm splints. NWB to LUE. Her hospital course was complicated by urinary retention requiring foley catheter placement; this did resolve and foley was discontinued. Patient worked with therapies during this admission. On 3/13 the patient was voiding well,  tolerating diet, working well with therapies, pain well controlled, vital signs stable and felt stable for discharge to SNF.  Patient will follow up as below and knows to call with questions or concerns.   I have personally reviewed the patients medication history on the [place] controlled substance database.    Allergies as of 12/17/2017      Reactions   Amoxicillin Itching   Oxycodone Itching   Penicillin G Rash      Medication List    TAKE these medications   acetaminophen 325 MG tablet Commonly known as:  TYLENOL Take 2 tablets (650 mg total) by mouth every 6 (six) hours as needed for mild pain.   atorvastatin 10 MG tablet Commonly known as:  LIPITOR Take 10 mg by mouth.   docusate sodium 100 MG capsule Commonly known as:  COLACE Take 1 capsule (100 mg total) by mouth 2 (two) times daily.   donepezil 5 MG tablet Commonly known as:  ARICEPT Take 5 mg by mouth daily.   levothyroxine 75 MCG tablet Commonly known as:  SYNTHROID, LEVOTHROID Take 75 mcg by mouth daily.   losartan 100 MG tablet Commonly known as:  COZAAR Take 100 mg by mouth daily.   mirtazapine 15 MG tablet Commonly known as:  REMERON Take 7.5 mg by mouth 2 (two) times daily.   multivitamin with minerals Tabs tablet Take 1 tablet by mouth daily. Start taking on:  12/18/2017   polyethylene glycol packet Commonly known as:  MIRALAX / GLYCOLAX Take 17 g by mouth 2 (two) times daily.   traMADol 50 MG tablet Commonly known as:  ULTRAM Take 1 tablet (50  mg total) by mouth every 6 (six) hours as needed for severe pain.        Follow-up Information    Eustace Moore, MD. Call in 2 week(s).   Specialty:  Neurosurgery Why:  call to arrange follow up regarding your spine injury Contact information: [address] [place] [address] [phone]        Iran Planas, MD. Call in 1 week(s).   Specialty:  Orthopedic Surgery Why:  call to arrange follow up regarding your hand/wrist  injuries Contact information: [address] [place] [address] [phone]           Signed: Wellington Hampshire, [facility] 12/17/2017, 3:26 PM Pager: [phone] Consults: [phone] Mon-Fri 7:00 am-4:30 pm Sat-Sun 7:00 am-11:30 am

## 2018-05-24 IMAGING — DX DG THORACIC SPINE 2V
3 series · 3 of 3 positions shown · non-contrast
Comparison: CT 12/13/2017

CLINICAL DATA: Thoracic compression fracture

EXAM:
THORACIC SPINE 2 VIEWS

[t-spine lat (1 of 2)]
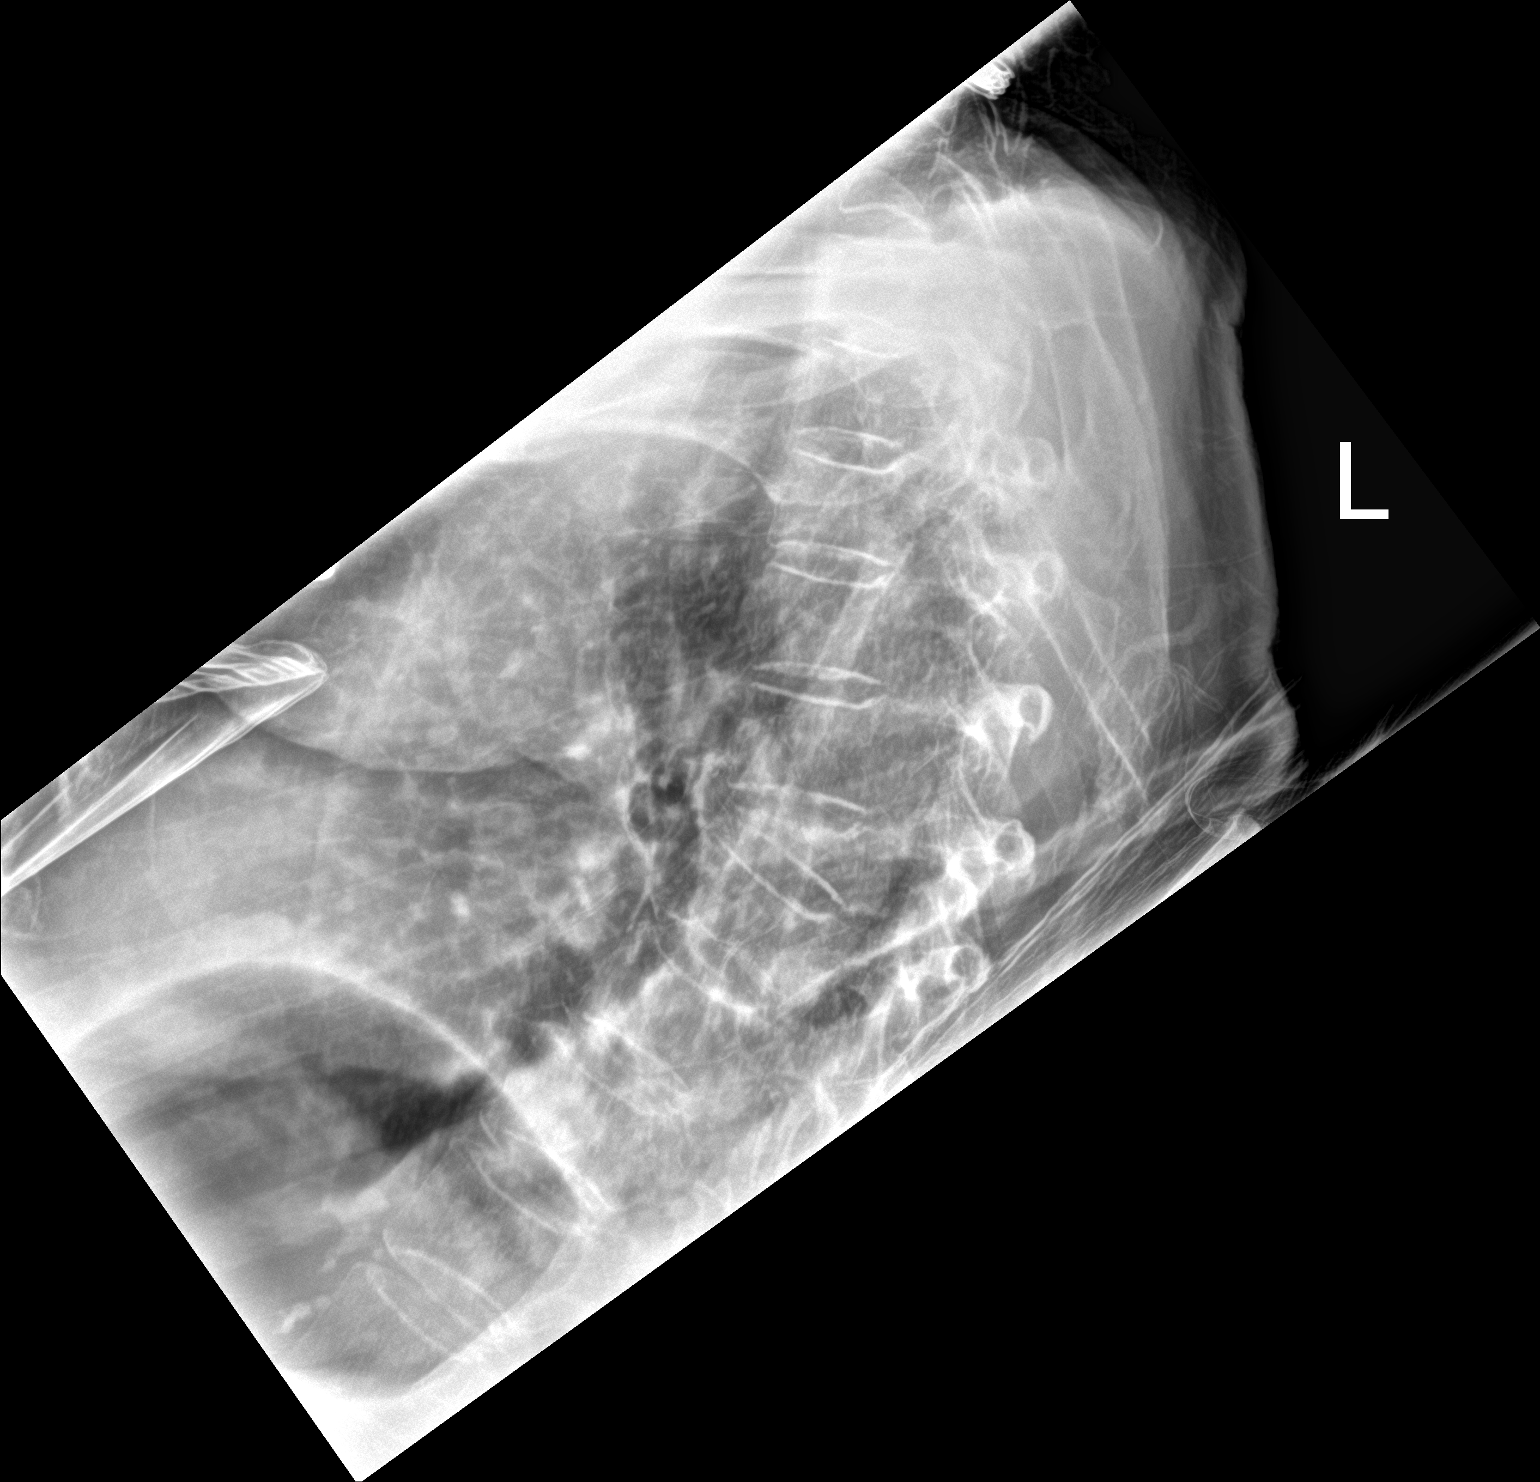

[t-spine ap]
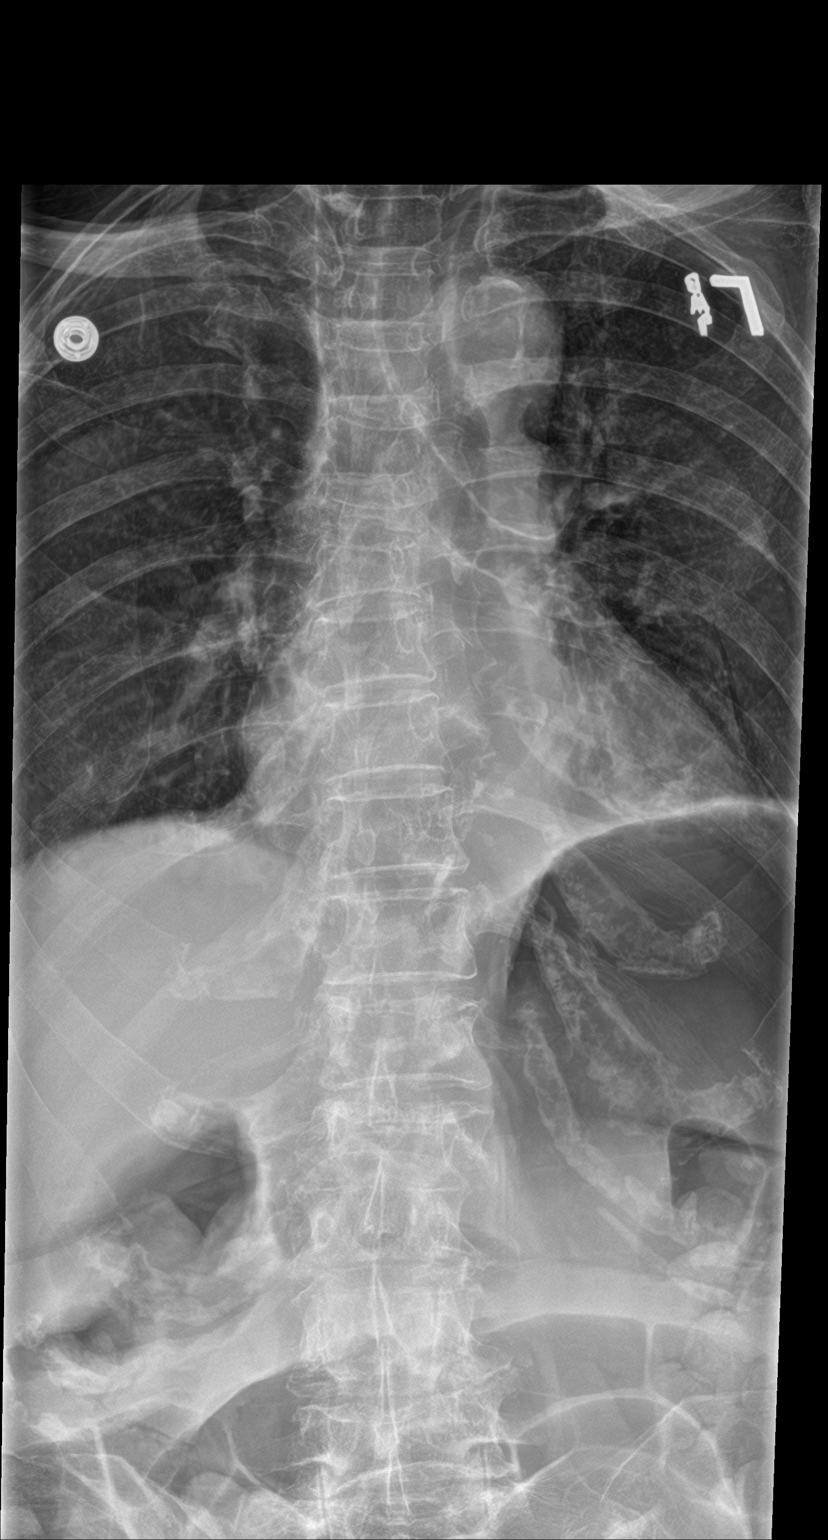

[t-spine lat (2 of 2)]
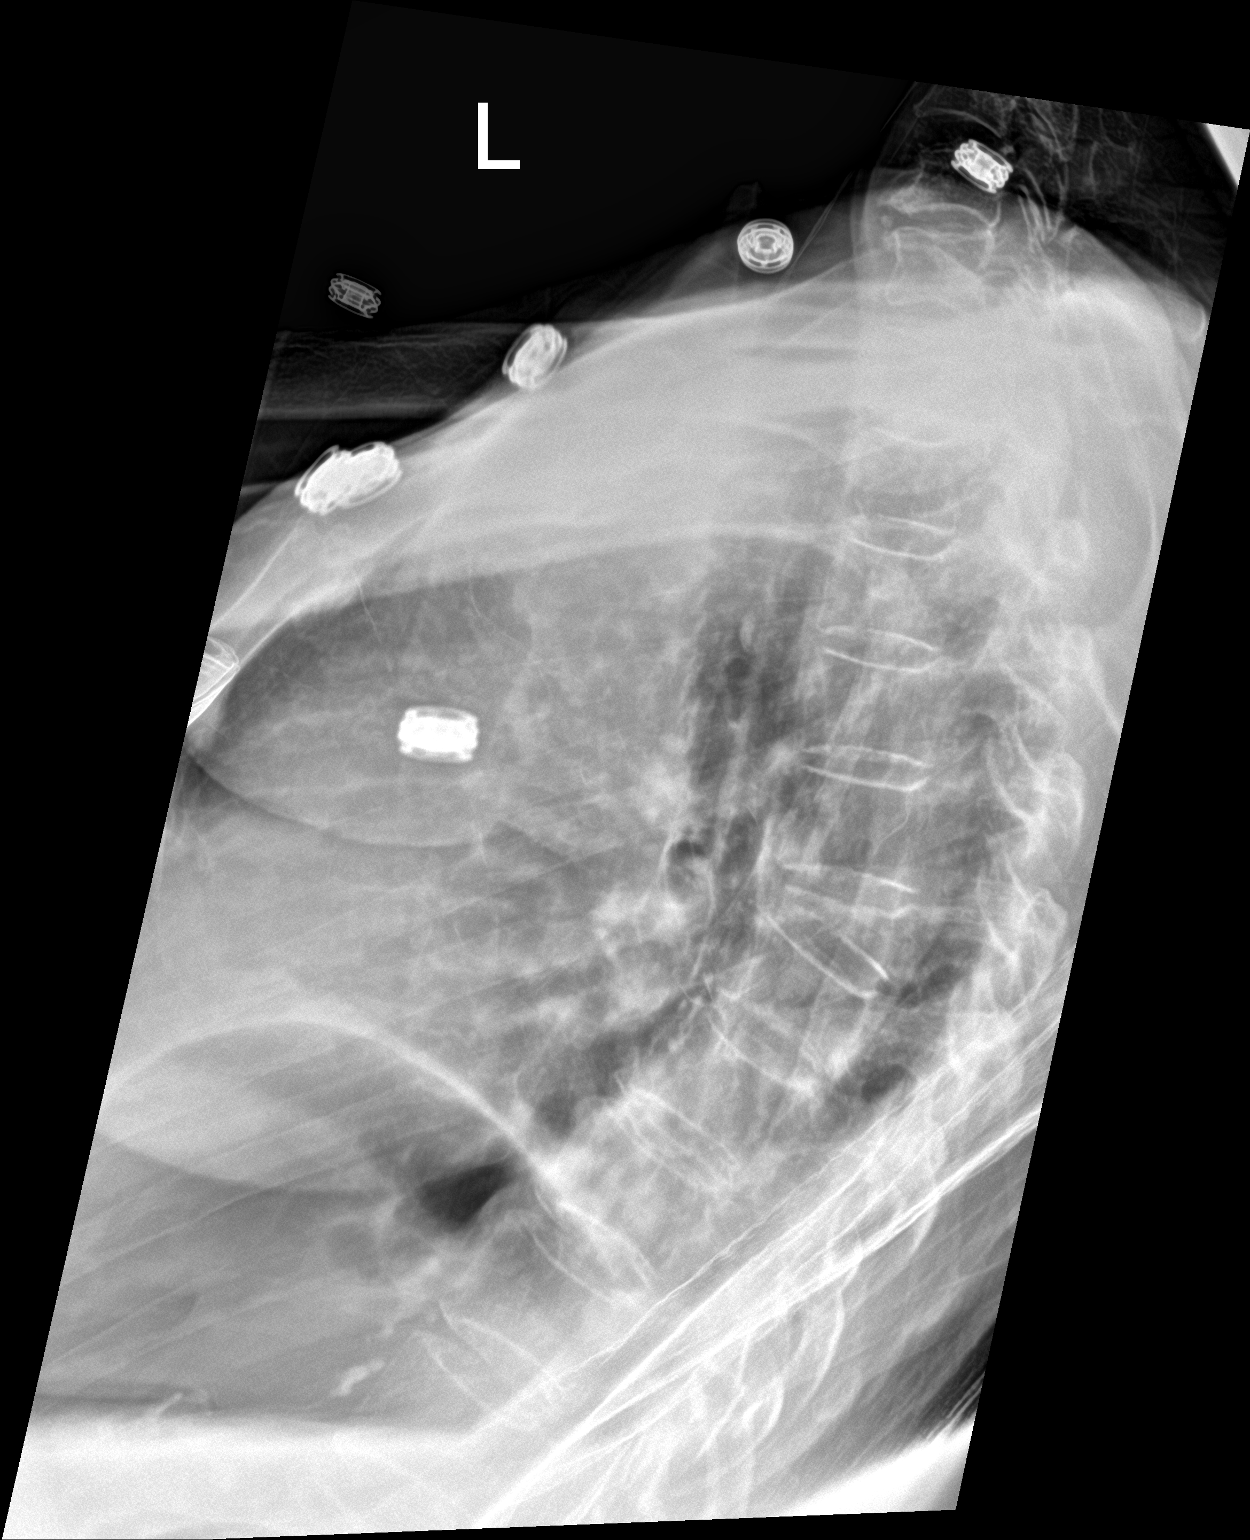

[3 of 3 positions shown; findings below may reference images not displayed]

FINDINGS: Severe compression fracture noted in the midthoracic spine, likely
T6 level. Kyphotic deformity centered at this level. No subluxation.
No visible retropulsed fracture fragments.
IMPRESSION: Severe compression deformity at T6.

## 2018-07-28 ENCOUNTER — Emergency Department (HOSPITAL_BASED_OUTPATIENT_CLINIC_OR_DEPARTMENT_OTHER): Payer: Medicare Other

## 2018-07-28 ENCOUNTER — Emergency Department (HOSPITAL_BASED_OUTPATIENT_CLINIC_OR_DEPARTMENT_OTHER)
Admission: EM | Admit: 2018-07-28 | Discharge: 2018-07-28 | Disposition: A | Discharge status: Home / Self Care | Payer: Medicare Other | Attending: Emergency Medicine | Admitting: Emergency Medicine

## 2018-07-28 ENCOUNTER — Encounter (HOSPITAL_BASED_OUTPATIENT_CLINIC_OR_DEPARTMENT_OTHER): Payer: Self-pay | Admitting: *Deleted

## 2018-07-28 ENCOUNTER — Other Ambulatory Visit: Payer: Self-pay

## 2018-07-28 DIAGNOSIS — Z8673 Personal history of transient ischemic attack (TIA), and cerebral infarction without residual deficits: Secondary | ICD-10-CM | POA: Insufficient documentation

## 2018-07-28 DIAGNOSIS — Y999 Unspecified external cause status: Secondary | ICD-10-CM | POA: Insufficient documentation

## 2018-07-28 DIAGNOSIS — F0280 Dementia in other diseases classified elsewhere without behavioral disturbance: Secondary | ICD-10-CM | POA: Insufficient documentation

## 2018-07-28 DIAGNOSIS — S52601A Unspecified fracture of lower end of right ulna, initial encounter for closed fracture: Secondary | ICD-10-CM | POA: Diagnosis not present

## 2018-07-28 DIAGNOSIS — S52501A Unspecified fracture of the lower end of right radius, initial encounter for closed fracture: Secondary | ICD-10-CM

## 2018-07-28 DIAGNOSIS — G309 Alzheimer's disease, unspecified: Secondary | ICD-10-CM | POA: Insufficient documentation

## 2018-07-28 DIAGNOSIS — E039 Hypothyroidism, unspecified: Secondary | ICD-10-CM | POA: Diagnosis not present

## 2018-07-28 DIAGNOSIS — S6991XA Unspecified injury of right wrist, hand and finger(s), initial encounter: Secondary | ICD-10-CM | POA: Diagnosis present

## 2018-07-28 DIAGNOSIS — Y9389 Activity, other specified: Secondary | ICD-10-CM | POA: Insufficient documentation

## 2018-07-28 DIAGNOSIS — Y92002 Bathroom of unspecified non-institutional (private) residence single-family (private) house as the place of occurrence of the external cause: Secondary | ICD-10-CM | POA: Insufficient documentation

## 2018-07-28 DIAGNOSIS — I10 Essential (primary) hypertension: Secondary | ICD-10-CM | POA: Insufficient documentation

## 2018-07-28 DIAGNOSIS — W1830XA Fall on same level, unspecified, initial encounter: Secondary | ICD-10-CM | POA: Insufficient documentation

## 2018-07-28 DIAGNOSIS — W19XXXA Unspecified fall, initial encounter: Secondary | ICD-10-CM

## 2018-07-28 DIAGNOSIS — Z79899 Other long term (current) drug therapy: Secondary | ICD-10-CM | POA: Diagnosis not present

## 2018-07-28 HISTORY — DX: Unspecified dementia, unspecified severity, without behavioral disturbance, psychotic disturbance, mood disturbance, and anxiety: F03.90

## 2018-07-28 HISTORY — DX: Dementia in other diseases classified elsewhere, unspecified severity, without behavioral disturbance, psychotic disturbance, mood disturbance, and anxiety: F02.80

## 2018-07-28 HISTORY — DX: Alzheimer's disease, unspecified: G30.9

## 2018-07-28 MED ORDER — TRAMADOL HCL 50 MG PO TABS: 50.0000 mg | ORAL_TABLET | Freq: Four times a day (QID) | ORAL | 0 refills | Status: AC | PRN

## 2018-07-28 NOTE — ED Provider Notes (Signed)
[facility] EMERGENCY DEPARTMENT Provider Note   CSN: [id number] Arrival date & time: 07/28/18  1649     History   Chief Complaint Chief Complaint  Patient presents with  . Fall    HPI Judith Henderson is a 84 y.o. female.  Patient with unwitnessed fall during the night.  No apparent injuries other than her right wrist.  Patient right-hand-dominant.  Patient with complaint of pain at the distal forearm area.  Patient does have a history of dementia.  Family states no other obvious injuries.     Past Medical History:  Diagnosis Date  . Alzheimer disease ([place])   . Dementia ([place])   . Essential hypertension   . Multiple myeloma ([place])   . Stroke [facility])     Patient Active Problem List   Diagnosis Date Noted  . Fall 12/16/2017  . Thoracic spine fracture ([place]) 12/13/2017  . Hyperlipidemia 12/12/2016  . Hemorrhagic stroke ([place])   . Diastolic dysfunction   . Diabetes mellitus type 2 in nonobese (HCC)   . Multiple myeloma ([place])   . Benign essential HTN   . Tachycardia   . Hypothyroidism   . Hypokalemia   . Hypoalbuminemia due to protein-calorie malnutrition ([place])   . Acute blood loss anemia   . Essential hypertension   . ICH (intracerebral hemorrhage) ([place]) 09/07/2016    History reviewed. No pertinent surgical history.   OB History   None      Home Medications    Prior to Admission medications   Medication Sig Start Date End Date Taking? Authorizing Provider  acetaminophen (TYLENOL) 325 MG tablet Take 2 tablets (650 mg total) by mouth every 6 (six) hours as needed for mild pain. 12/17/17  Yes Meuth, Brooke A, PA-C  atorvastatin (LIPITOR) 10 MG tablet Take 10 mg by mouth. 11/08/16  Yes [provider]  docusate sodium (COLACE) 100 MG capsule Take 1 capsule (100 mg total) by mouth 2 (two) times daily. 12/17/17  Yes Meuth, Brooke A, PA-C  donepezil (ARICEPT) 5 MG tablet Take 5 mg by mouth daily. 12/02/17  Yes [provider]  levothyroxine  (SYNTHROID, LEVOTHROID) 75 MCG tablet Take 75 mcg by mouth daily. 08/21/16  Yes [provider]  losartan (COZAAR) 100 MG tablet Take 100 mg by mouth daily. 08/23/16  Yes [provider]  mirtazapine (REMERON) 15 MG tablet Take 7.5 mg by mouth 2 (two) times daily.  08/21/16  Yes [provider]  Multiple Vitamin (MULTIVITAMIN WITH MINERALS) TABS tablet Take 1 tablet by mouth daily. 12/18/17  Yes Meuth, Brooke A, PA-C  polyethylene glycol (MIRALAX / GLYCOLAX) packet Take 17 g by mouth 2 (two) times daily. 12/17/17  Yes Meuth, Brooke A, PA-C  traMADol (ULTRAM) 50 MG tablet Take 1 tablet (50 mg total) by mouth every 6 (six) hours as needed for severe pain. 12/17/17   Meuth, Brooke A, PA-C  traMADol (ULTRAM) 50 MG tablet Take 1 tablet (50 mg total) by mouth every 6 (six) hours as needed. 07/28/18   Fredia Sorrow, MD    Family History No family history on file.  Social History Social History   Tobacco Use  . Smoking status: Never Smoker  . Smokeless tobacco: Never Used  Substance Use Topics  . Alcohol use: No  . Drug use: No     Allergies   Amoxicillin; Oxycodone; and Penicillin g   Review of Systems Review of Systems  Constitutional: Negative for fever.  HENT: Negative for congestion.   Eyes:  Negative for redness.  Respiratory: Negative for shortness of breath.   Cardiovascular: Negative for chest pain.  Gastrointestinal: Negative for abdominal pain.  Musculoskeletal: Negative for back pain and neck pain.  Skin: Negative for wound.  Neurological: Negative for syncope and headaches.  Psychiatric/Behavioral: Positive for confusion.     Physical Exam Updated Vital Signs BP 119/66 (BP Location: Left Arm)   Pulse 69   Temp 98.3 F (36.8 C) (Oral)   Resp 20   Ht 1.575 m ('5\' 2"' )   Wt 49.4 kg   SpO2 100%   BMI 19.92 kg/m   Physical Exam  Constitutional: She is oriented to person, place, and time. She appears well-developed and well-nourished. No  distress.  HENT:  Mouth/Throat: Oropharynx is clear and moist.  Eyes: Pupils are equal, round, and reactive to light. Conjunctivae and EOM are normal.  Neck: Normal range of motion. Neck supple.  Cardiovascular: Normal rate, regular rhythm and normal heart sounds.  Pulmonary/Chest: Effort normal and breath sounds normal. No respiratory distress.  Abdominal: Soft. Bowel sounds are normal. There is no tenderness.  Musculoskeletal: Normal range of motion. She exhibits edema and tenderness.  Radial pulses 2+ right wrist and 2+ left wrist.  There is some bruising and swelling to the distal forearm.  There is also a little bit of proximal bruising swelling as well.  Good movement of her fingers good movement of the elbow good movement of shoulder.  Palpable tenderness to the distal radius.  Neurological: She is alert and oriented to person, place, and time. No cranial nerve deficit or sensory deficit. She exhibits normal muscle tone. Coordination normal.  Skin: Skin is warm. Capillary refill takes less than 2 seconds.  Nursing note and vitals reviewed.    ED Treatments / Results  Labs (all labs ordered are listed, but only abnormal results are displayed) Labs Reviewed - No data to display  EKG None  Radiology Dg Forearm Right  Result Date: 07/28/2018 CLINICAL DATA:  Fall today with distal right forearm pain EXAM: RIGHT FOREARM - 2 VIEW COMPARISON:  None. FINDINGS: Mildly impacted right distal radius metaphysis fracture with no significant displacement. Nondisplaced intra-articular right distal ulna fracture. Soft tissue swelling throughout the right wrist. No suspicious focal osseous lesion. No dislocation at the right wrist or right elbow on these views. Advanced first carpometacarpal joint osteoarthritis. Nonspecific soft tissue calcification anterior to the medial distal right humerus. IMPRESSION: 1. Mildly impacted nondisplaced right distal radius fracture. 2. Nondisplaced right distal ulna  fracture. Electronically Signed   By: Ilona Sorrel M.D.   On: 07/28/2018 18:46   Dg Wrist Complete Right  Result Date: 07/28/2018 CLINICAL DATA:  Right wrist pain after fall today EXAM: RIGHT WRIST - COMPLETE 3+ VIEW COMPARISON:  None. FINDINGS: Impacted comminuted intra-articular right distal radius fracture without significant displacement. Nondisplaced intra-articular right distal ulna fracture. No dislocation. No suspicious focal osseous lesion. Slight widening of the scapholunate interval. Severe first carpometacarpal joint osteoarthritis. Severe PIP joint osteoarthritis in the right fifth finger. Diffuse osteopenia. No radiopaque foreign body. IMPRESSION: 1. Impacted comminuted nondisplaced intra-articular right distal radius fracture. 2. Nondisplaced intra-articular right distal ulna fracture. 3. Slight widening of the scapholunate interval, which may indicate scapholunate ligamentous injury. No dislocation. 4. Severe polyarticular osteoarthritis as detailed. Electronically Signed   By: Ilona Sorrel M.D.   On: 07/28/2018 18:48    Procedures Procedures (including critical care time)  Medications Ordered in ED Medications - No data to display   Initial Impression /  Assessment and Plan / ED Course  I have reviewed the triage vital signs and the nursing notes.  Pertinent labs & imaging results that were available during my care of the patient were reviewed by me and considered in my medical decision making (see chart for details).    X-ray show slightly displaced distal radius fracture.  There is also a nondisplaced distal ulnar fracture.  Patient will be treated with sugar tong splint.  Patient's been followed by [facility] in the past.  They will call for follow-up in the morning.  Patient will be given a sling.  Elevation of her arm is much as possible in the meantime.  Short course of tramadol for the pain.  Patient without evidence of any other injury other than this.  Final  Clinical Impressions(s) / ED Diagnoses   Final diagnoses:  Fall, initial encounter  Closed fracture of distal ends of right radius and ulna, initial encounter    ED Discharge Orders         Ordered    traMADol (ULTRAM) 50 MG tablet  Every 6 hours PRN     07/28/18 1913           Fredia Sorrow, MD 07/28/18 1939

## 2018-07-28 NOTE — Discharge Instructions (Signed)
Elevate right arm is much as possible.  Place a pillow on it when you are sleeping to raise it above your chest.  Keep the splint in place.  Wear the sling as much as possible.  Call [facility] orthopedics now called [facility] tomorrow for follow-up for the distal radius and ulnar fracture.  Tramadol provided to help with pain as needed.

## 2018-07-28 NOTE — ED Triage Notes (Signed)
She fell last night while going to the bathroom. Injury to her right forearm and wrist.

## 2020-06-30 IMAGING — DX DG FOREARM 2V*R*
2 series · 2 of 2 positions shown · non-contrast
Comparison: None.

CLINICAL DATA: Fall today with distal right forearm pain

EXAM:
RIGHT FOREARM - 2 VIEW

[forearm ap]
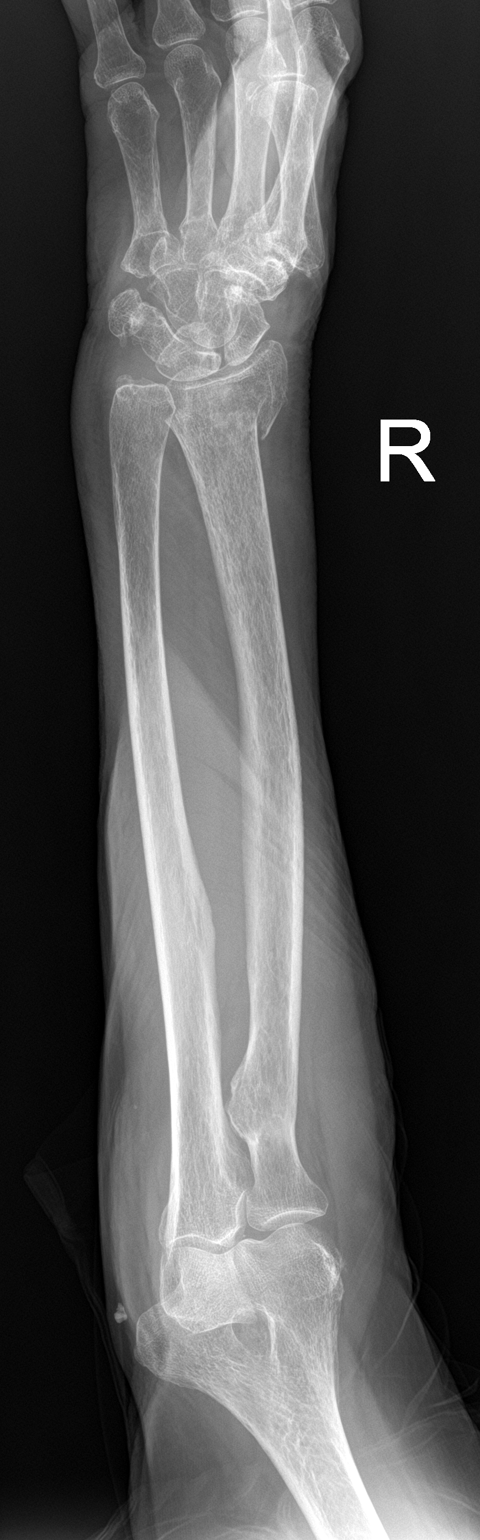

[forearm lat]
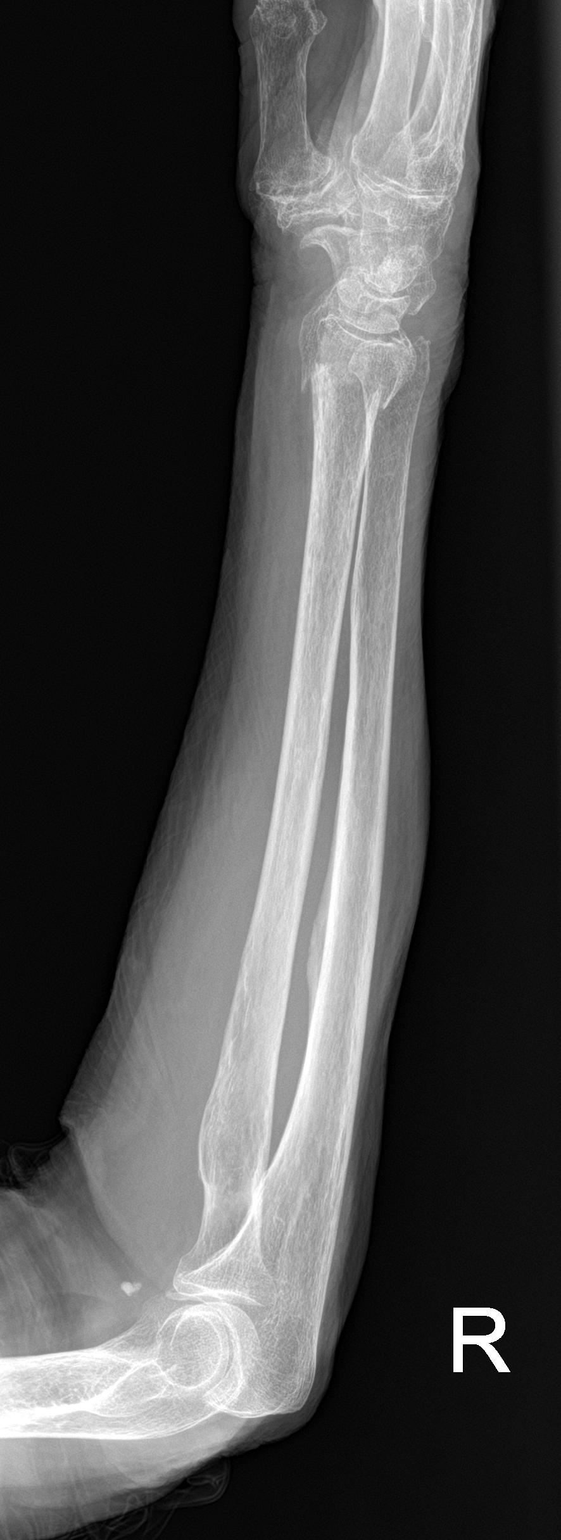

[2 of 2 positions shown; findings below may reference images not displayed]

FINDINGS: Mildly impacted right distal radius metaphysis fracture with no
significant displacement. Nondisplaced intra-articular right distal
ulna fracture. Soft tissue swelling throughout the right wrist. No
suspicious focal osseous lesion. No dislocation at the right wrist
or right elbow on these views. Advanced first carpometacarpal joint
osteoarthritis. Nonspecific soft tissue calcification anterior to
the medial distal right humerus.
IMPRESSION: 1. Mildly impacted nondisplaced right distal radius fracture.
2. Nondisplaced right distal ulna fracture.

## 2021-07-07 DEATH — deceased
